# Patient Record
Sex: Male | Born: 1965 | Race: Black or African American | Hispanic: No | Marital: Single | Smoking: Current every day smoker
Health system: Southern US, Community
[De-identification: ages and names within clinical notes are randomized; demographics above are authoritative.]

## PROBLEM LIST (undated history)

## (undated) DIAGNOSIS — Z9119 Patient's noncompliance with other medical treatment and regimen: Secondary | ICD-10-CM

## (undated) DIAGNOSIS — I10 Essential (primary) hypertension: Secondary | ICD-10-CM

## (undated) DIAGNOSIS — Z91199 Patient's noncompliance with other medical treatment and regimen due to unspecified reason: Secondary | ICD-10-CM

## (undated) DIAGNOSIS — E119 Type 2 diabetes mellitus without complications: Secondary | ICD-10-CM

---

## 2002-04-23 ENCOUNTER — Emergency Department (HOSPITAL_COMMUNITY): Admission: EM | Admit: 2002-04-23 | Discharge: 2002-04-23 | Payer: Self-pay | Admitting: Emergency Medicine

## 2004-01-03 ENCOUNTER — Other Ambulatory Visit: Payer: Self-pay

## 2004-08-26 ENCOUNTER — Emergency Department: Payer: Self-pay | Admitting: Emergency Medicine

## 2004-09-06 ENCOUNTER — Emergency Department: Payer: Self-pay | Admitting: Emergency Medicine

## 2005-08-30 ENCOUNTER — Emergency Department: Payer: Self-pay | Admitting: Internal Medicine

## 2005-10-04 ENCOUNTER — Emergency Department: Payer: Self-pay | Admitting: Emergency Medicine

## 2005-12-27 ENCOUNTER — Emergency Department: Payer: Self-pay | Admitting: General Practice

## 2005-12-28 ENCOUNTER — Emergency Department: Payer: Self-pay | Admitting: Emergency Medicine

## 2007-10-09 ENCOUNTER — Emergency Department: Payer: Self-pay | Admitting: Emergency Medicine

## 2010-03-02 ENCOUNTER — Emergency Department: Payer: Self-pay | Admitting: Emergency Medicine

## 2010-03-27 ENCOUNTER — Observation Stay: Payer: Self-pay | Admitting: Specialist

## 2010-04-06 ENCOUNTER — Emergency Department: Payer: Self-pay | Admitting: Emergency Medicine

## 2010-04-07 ENCOUNTER — Inpatient Hospital Stay: Payer: Self-pay | Admitting: Internal Medicine

## 2010-04-29 ENCOUNTER — Emergency Department: Payer: Self-pay | Admitting: Emergency Medicine

## 2010-06-18 ENCOUNTER — Inpatient Hospital Stay: Payer: Self-pay | Admitting: Internal Medicine

## 2010-07-06 ENCOUNTER — Emergency Department: Payer: Self-pay | Admitting: Emergency Medicine

## 2010-11-06 ENCOUNTER — Emergency Department: Payer: Self-pay | Admitting: *Deleted

## 2010-11-09 ENCOUNTER — Emergency Department: Payer: Self-pay | Admitting: Emergency Medicine

## 2010-11-15 ENCOUNTER — Emergency Department: Payer: Self-pay | Admitting: Emergency Medicine

## 2010-11-23 ENCOUNTER — Emergency Department: Payer: Self-pay | Admitting: Emergency Medicine

## 2011-01-15 ENCOUNTER — Emergency Department: Payer: Self-pay | Admitting: Emergency Medicine

## 2012-08-01 ENCOUNTER — Emergency Department: Payer: Self-pay | Admitting: Emergency Medicine

## 2012-11-01 ENCOUNTER — Emergency Department: Payer: Self-pay | Admitting: Emergency Medicine

## 2012-11-03 ENCOUNTER — Emergency Department: Payer: Self-pay | Admitting: Emergency Medicine

## 2014-11-24 ENCOUNTER — Emergency Department
Admission: EM | Admit: 2014-11-24 | Discharge: 2014-11-24 | Disposition: A | Discharge status: Home / Self Care | Payer: Self-pay | Attending: Emergency Medicine | Admitting: Emergency Medicine

## 2014-11-24 ENCOUNTER — Emergency Department: Payer: Self-pay

## 2014-11-24 DIAGNOSIS — X58XXXA Exposure to other specified factors, initial encounter: Secondary | ICD-10-CM | POA: Insufficient documentation

## 2014-11-24 DIAGNOSIS — Z79899 Other long term (current) drug therapy: Secondary | ICD-10-CM | POA: Insufficient documentation

## 2014-11-24 DIAGNOSIS — I10 Essential (primary) hypertension: Secondary | ICD-10-CM | POA: Insufficient documentation

## 2014-11-24 DIAGNOSIS — Y9389 Activity, other specified: Secondary | ICD-10-CM | POA: Insufficient documentation

## 2014-11-24 DIAGNOSIS — Z720 Tobacco use: Secondary | ICD-10-CM | POA: Insufficient documentation

## 2014-11-24 DIAGNOSIS — S46311A Strain of muscle, fascia and tendon of triceps, right arm, initial encounter: Secondary | ICD-10-CM | POA: Insufficient documentation

## 2014-11-24 DIAGNOSIS — Y9289 Other specified places as the place of occurrence of the external cause: Secondary | ICD-10-CM | POA: Insufficient documentation

## 2014-11-24 DIAGNOSIS — Y998 Other external cause status: Secondary | ICD-10-CM | POA: Insufficient documentation

## 2014-11-24 DIAGNOSIS — Z794 Long term (current) use of insulin: Secondary | ICD-10-CM | POA: Insufficient documentation

## 2014-11-24 DIAGNOSIS — E119 Type 2 diabetes mellitus without complications: Secondary | ICD-10-CM | POA: Insufficient documentation

## 2014-11-24 HISTORY — DX: Essential (primary) hypertension: I10

## 2014-11-24 HISTORY — DX: Type 2 diabetes mellitus without complications: E11.9

## 2014-11-24 MED ORDER — FENTANYL CITRATE (PF) 100 MCG/2ML IJ SOLN
50.0000 ug | Freq: Once | INTRAMUSCULAR | Status: AC
  Administered 2014-11-24: 50 ug via INTRAVENOUS

## 2014-11-24 MED ORDER — FENTANYL CITRATE (PF) 100 MCG/2ML IJ SOLN
INTRAMUSCULAR | Status: AC
  Administered 2014-11-24: 50 ug via INTRAVENOUS
  Filled 2014-11-24: qty 2

## 2014-11-24 MED ORDER — DOCUSATE SODIUM 100 MG PO CAPS: ORAL_CAPSULE | ORAL | Status: DC

## 2014-11-24 MED ORDER — HYDROMORPHONE HCL 1 MG/ML IJ SOLN
1.0000 mg | Freq: Once | INTRAMUSCULAR | Status: AC
Start: 2014-11-24 — End: 2014-11-24
  Administered 2014-11-24: 1 mg via INTRAVENOUS

## 2014-11-24 MED ORDER — OXYCODONE-ACETAMINOPHEN 5-325 MG PO TABS: 1.0000 | ORAL_TABLET | ORAL | Status: DC | PRN

## 2014-11-24 MED ORDER — HYDROMORPHONE HCL 1 MG/ML IJ SOLN
INTRAMUSCULAR | Status: AC
  Administered 2014-11-24: 1 mg via INTRAVENOUS
  Filled 2014-11-24: qty 1

## 2014-11-24 MED ORDER — OXYCODONE-ACETAMINOPHEN 5-325 MG PO TABS
2.0000 | ORAL_TABLET | ORAL | Status: AC
  Administered 2014-11-24: 2 via ORAL

## 2014-11-24 MED ORDER — OXYCODONE-ACETAMINOPHEN 5-325 MG PO TABS
ORAL_TABLET | ORAL | Status: AC
  Administered 2014-11-24: 2 via ORAL
  Filled 2014-11-24: qty 2

## 2014-11-24 MED ORDER — HYDROMORPHONE HCL 1 MG/ML IJ SOLN
INTRAMUSCULAR | Status: AC
  Filled 2014-11-24: qty 1

## 2014-11-24 MED ORDER — HYDROMORPHONE HCL 1 MG/ML IJ SOLN
1.0000 mg | Freq: Once | INTRAMUSCULAR | Status: AC
  Administered 2014-11-24: 1 mg via INTRAVENOUS

## 2014-11-24 NOTE — ED Notes (Signed)
Pt was bench pressing 335lbs at gym and sudden onset of pain to left elbow.  Pt reports hearing popping sound.

## 2014-11-24 NOTE — ED Notes (Signed)
MD at bedside for patient update

## 2014-11-24 NOTE — Discharge Instructions (Signed)
As we discussed, it is very important that she would not use your arm to carry any weight until the orthopedic surgeon says it is okay to do so.  Please keep your splint in place and use her sling as needed for support.  Keep the splint clean and dry.  Call Dr. Martha ClanKrasinski tomorrow for an appointment this week if possible to follow-up.  If you develop new or worsening symptoms that concern you, please return to the emergency department.  Take Percocet as prescribed. Do not drink alcohol, drive or participate in any other potentially dangerous activities while taking this medication as it may make you sleepy. Do not take this medication with any other sedating medications, either prescription or over-the-counter. If you were prescribed Percocet or Vicodin, do not take these with acetaminophen (Tylenol) as it is already contained within these medications.   This medication is an opiate (or narcotic) pain medication and can be habit forming.  Use it as little as possible to achieve adequate pain control.  Do not use or use it with extreme caution if you have a history of opiate abuse or dependence.  If you are on a pain contract with your primary care doctor or a pain specialist, be sure to let them know you were prescribed this medication today from the [facility].  This medication is intended for your use only - do not give any to anyone else and keep it in a secure place where nobody else, especially children, have access to it.  It will also cause or worsen constipation, so you may want to consider taking an over-the-counter stool softener while you are taking this medication.   Tendon Injury Tendons are strong, cordlike structures that connect muscle to bone. Tendons are made up of woven fibers, like a rope. A tendon injury is a tear (rupture) of the tendon. The rupture may be partial (only a few of the fibers in your tendon rupture) or complete (your entire tendon  ruptures). CAUSES  Tendon injuries can be caused by high-stress activities, such as sports. They also can be caused by a repetitive injury or by a single injury from an excessive, rapid force. SYMPTOMS  Symptoms of tendon injury include pain when you move the joint close to the tendon. Other symptoms are swelling, redness, and warmth. DIAGNOSIS  Tendon injuries often can be diagnosed by physical exam. However, sometimes an X-ray exam or advanced imaging, such as magnetic resonance imaging (MRI), is necessary to determine the extent of the injury. TREATMENT  Partial tendon ruptures often can be treated with immobilization. A splint, bandage, or removable brace usually is used to immobilize the injured tendon. Most injured tendons need to be immobilized for 1-2 months before they are completely healed. Complete tendon ruptures may require surgical reattachment. Document Released: 08/03/2004 Document Revised: 06/15/2011 Document Reviewed: 09/17/2011 [facility] Patient Information 2015 [employer], [place]. This information is not intended to replace advice given to you by your health care provider. Make sure you discuss any questions you have with your health care provider.

## 2014-11-24 NOTE — ED Notes (Signed)

## 2014-11-24 NOTE — ED Provider Notes (Signed)
[facility] Provider Note  ____________________________________________  Time seen: Approximately 7:22 PM  I have reviewed the triage vital signs and the nursing notes.   HISTORY  Chief Complaint Elbow Pain    HPI Gary Henderson is a 49 y.o. male with a history of diabetes and hypertension who was bench pressing 335 pounds when he felt a snap in his right elbow.  He reports that it was deformed with a "huge bulge coming out the back".He squeezed it impressed on it and the pain is slightly improved now but he required Dilaudid for pain control after his arrival in the ED.  He has no numbness or tingling down the arm, just pain in the elbow and the distal upper arm.  He sustained no other injuries.  The pain is severe.   Past Medical History  Diagnosis Date  . Diabetes mellitus without complication   . Hypertension     There are no active problems to display for this patient.   History reviewed. No pertinent past surgical history.  Current Outpatient Rx  Name  Route  Sig  Dispense  Refill  . insulin glargine (LANTUS) 100 UNIT/ML injection   Subcutaneous   Inject 60 Units into the skin daily.         . metFORMIN (GLUCOPHAGE) 500 MG tablet   Oral   Take 500 mg by mouth 2 (two) times daily with a meal.           Allergies Review of patient's allergies indicates no known allergies.  History reviewed. No pertinent family history.  Social History History  Substance Use Topics  . Smoking status: Current Every Day Smoker -- 1.00 packs/day for 20 years    Types: Cigarettes  . Smokeless tobacco: Never Used  . Alcohol Use: No    Review of Systems Constitutional: No fever/chills Eyes: No visual changes. ENT: No sore throat. Cardiovascular: Denies chest pain. Respiratory: Denies shortness of breath. Gastrointestinal: No abdominal pain.  No nausea, no vomiting.  No diarrhea.  No constipation. Genitourinary: Negative for  dysuria. Musculoskeletal: Pain with movement of the right elbow  Skin: Negative for rash. Neurological: Negative for headaches, focal weakness or numbness.  10-point ROS otherwise negative.  ____________________________________________   PHYSICAL EXAM:  VITAL SIGNS: ED Triage Vitals  Enc Vitals Group     BP --      Pulse Rate 11/24/14 1901 93     Resp --      Temp 11/24/14 1901 98.2 F (36.8 C)     Temp Source 11/24/14 1901 Oral     SpO2 11/24/14 1901 100 %     Weight 11/24/14 1901 230 lb (104.327 kg)     Height 11/24/14 1901 6' (1.829 m)     Head Cir --      Peak Flow --      Pain Score 11/24/14 1902 10     Pain Loc --      Pain Edu? --      Excl. in GC? --     Constitutional: Alert and oriented. Well appearing and in mild to moderate distress from his elbow pain. Eyes: Conjunctivae are normal. PERRL. EOMI. Head: Atraumatic. Nose: No congestion/rhinnorhea. Cardiovascular: Normal rate, regular rhythm. Grossly normal heart sounds.  Good peripheral circulation. Respiratory: Normal respiratory effort.  No retractions. Lungs CTAB. Musculoskeletal: Right arm tenderness with any motion of his right elbow, particularly flexion.  Tenderness to palpation just proximal to the elbow in the region of  the triceps.  No gross deformity, no joint effusion, no skin defects.  Neurovascularly intact distal to the wound with normal cap refill, good arterial pulses, and normal sensation. Neurologic:  Normal speech and language. No gross focal neurologic deficits are appreciated. Speech is normal. No gait instability. Skin:  Skin is warm, dry and intact. No rash noted. Psychiatric: Mood and affect are normal. Speech and behavior are normal.  ____________________________________________   LABS (all labs ordered are listed, but only abnormal results are displayed)  Not indicated ____________________________________________  EKG  Not  indicated ____________________________________________  RADIOLOGY  Dg Elbow Complete Right  11/24/2014   CLINICAL DATA:  Pain after weight lifting injury today.  EXAM: RIGHT ELBOW - COMPLETE 3+ VIEW  COMPARISON:  None.  FINDINGS: There is no fracture or dislocation or joint effusion. There is calcification in the distal triceps tendon with soft tissue swelling. I suspect the patient has an acute rupture of the triceps tendon. 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  IMPRESSION: No acute osseous abnormality. Probable acute rupture of the distal triceps tendon with visible degenerative changes at the insertion and within the tendon.   Electronically Signed   By: Francene BoyersJames  Maxwell M.D.   On: 11/24/2014 19:17    ____________________________________________   PROCEDURES  Procedure(s) performed: splinting, see procedure note(s).  SPLINT APPLICATION Date/Time: 9:19 PM Authorized by: Loleta RoseFORBACH, Jocob Dambach Consent: Verbal consent obtained. Risks and benefits: risks, benefits and alternatives were discussed Consent given by: patient Splint applied by: ED technician Location details: right Splint type: long arm splint at 90 degrees right elbow flexion Supplies used: ortho-glass Post-procedure: The splinted body part was neurovascularly unchanged following the procedure. Patient tolerance: Patient tolerated the procedure well with no immediate complications.   Critical Care performed: No  ____________________________________________   INITIAL IMPRESSION / ASSESSMENT AND PLAN / ED COURSE  Pertinent labs & imaging results that were available during my care of the patient were reviewed by me and considered in my medical decision making (see chart for details).  It is possible that the patient dislocated his elbow and reduced it himself, but it is definitely not dislocated at this time.  I discussed the  case with Dr. Martha ClanKrasinski by phone and he concurred with my plan of immobilizing in a long-arm splint and following up in the clinic.  I gave the patient my usual and customary return precautions and prescribed him Percocet for pain control as well as a stool softener.  I stressed to him the importance of outpatient follow-up as he will likely need an outpatient MRI and surgery to correct his defect. ____________________________________________   FINAL CLINICAL IMPRESSION(S) / ED DIAGNOSES  Final diagnoses:  Triceps tendon rupture, right, initial encounter      Loleta Roseory Stillman Buenger, MD 11/24/14 2120

## 2017-06-07 ENCOUNTER — Encounter (HOSPITAL_COMMUNITY): Payer: Self-pay | Admitting: Emergency Medicine

## 2017-06-07 ENCOUNTER — Emergency Department (HOSPITAL_COMMUNITY)
Admission: EM | Admit: 2017-06-07 | Discharge: 2017-06-07 | Disposition: A | Discharge status: Home / Self Care | Payer: Self-pay | Attending: Emergency Medicine | Admitting: Emergency Medicine

## 2017-06-07 DIAGNOSIS — E1165 Type 2 diabetes mellitus with hyperglycemia: Secondary | ICD-10-CM | POA: Insufficient documentation

## 2017-06-07 DIAGNOSIS — F17210 Nicotine dependence, cigarettes, uncomplicated: Secondary | ICD-10-CM | POA: Insufficient documentation

## 2017-06-07 DIAGNOSIS — L02214 Cutaneous abscess of groin: Secondary | ICD-10-CM | POA: Insufficient documentation

## 2017-06-07 DIAGNOSIS — R739 Hyperglycemia, unspecified: Secondary | ICD-10-CM

## 2017-06-07 DIAGNOSIS — Z7984 Long term (current) use of oral hypoglycemic drugs: Secondary | ICD-10-CM | POA: Insufficient documentation

## 2017-06-07 DIAGNOSIS — I10 Essential (primary) hypertension: Secondary | ICD-10-CM | POA: Insufficient documentation

## 2017-06-07 LAB — CBG MONITORING, ED: GLUCOSE-CAPILLARY: 338 mg/dL — AB (ref 65–99)

## 2017-06-07 MED ORDER — SULFAMETHOXAZOLE-TRIMETHOPRIM 800-160 MG PO TABS: 1.0000 | ORAL_TABLET | Freq: Two times a day (BID) | ORAL | 0 refills | Status: AC

## 2017-06-07 MED ORDER — LIDOCAINE-EPINEPHRINE (PF) 2 %-1:200000 IJ SOLN
20.0000 mL | Freq: Once | INTRAMUSCULAR | Status: AC
  Administered 2017-06-07: 20 mL
  Filled 2017-06-07: qty 20

## 2017-06-07 NOTE — Discharge Instructions (Signed)
Hold the antibiotic and only take it if your swelling or pain become worse or if you develop fever, chills, bodyaches like you are getting sick. Contact a health care provider if: You have more redness, swelling, or pain around your abscess. You have more fluid or blood coming from your abscess. Your abscess feels warm to the touch. You have more pus or a bad smell coming from your abscess. You have a fever. You have muscle aches. You have chills or a general ill feeling. Get help right away if: You have severe pain. You see red streaks on your skin spreading away from the abscess.

## 2017-06-07 NOTE — ED Triage Notes (Signed)
Patient c/o draining abscess to right thigh x2 days. Denies fevers. Ambulatory.

## 2017-06-07 NOTE — ED Provider Notes (Signed)
[facility]-EMERGENCY DEPT Provider Note   CSN: [id number] Arrival date & time: 06/07/17  1056     History   Chief Complaint Chief Complaint  Patient presents with  . Abscess    HPI Gary Henderson is a 51 y.o. male thigh abscess the past medical history of hypertension and diabetes who presents the emergency department with chief complaint of right.  He noticed development of the abscess about 1 week ago.  He states that it enlarged and then seem to go away.  Over the past 3 days it has become more painful and swollen and has begun to drain some.  He denies fevers or chills.  He has a prior history of MRSA infection.  He denies any pain in the groin, pain out of proportion.  His blood sugar is noted to be elevated today which he blames on the recent Thanksgiving Feist.  HPI  Past Medical History:  Diagnosis Date  . Diabetes mellitus without complication (HCC)   . Hypertension     There are no active problems to display for this patient.   History reviewed. No pertinent surgical history.     Home Medications    Prior to Admission medications   Medication Sig Start Date End Date Taking? Authorizing Provider  docusate sodium (COLACE) 100 MG capsule Take 1 tablet once or twice daily as needed for constipation while taking narcotic pain medicine 11/24/14   Loleta RoseForbach, Cory, MD  insulin glargine (LANTUS) 100 UNIT/ML injection Inject 60 Units into the skin daily.    [provider]  metFORMIN (GLUCOPHAGE) 500 MG tablet Take 500 mg by mouth 2 (two) times daily with a meal.    [provider]  oxyCODONE-acetaminophen (ROXICET) 5-325 MG per tablet Take 1-2 tablets by mouth every 4 (four) hours as needed for severe pain. 11/24/14   Loleta RoseForbach, Cory, MD    Family History No family history on file.  Social History Social History   Tobacco Use  . Smoking status: Current Every Day Smoker    Packs/day: 1.00    Years: 20.00    Pack years: 20.00    Types: Cigarettes  . Smokeless tobacco: Never Used  Substance Use Topics  . Alcohol use: No  . Drug use: No     Allergies   Patient has no known allergies.   Review of Systems Review of Systems  Ten systems reviewed and are negative for acute change, except as noted in the HPI.   Physical Exam Updated Vital Signs BP (!) 148/96 (BP Location: Right Arm)   Pulse 99   Temp 98.2 F (36.8 C) (Oral)   Resp 18   SpO2 100%   Physical Exam  Constitutional: He is oriented to person, place, and time. He appears well-developed and well-nourished. No distress.  HENT:  Head: Normocephalic and atraumatic.  Eyes: Conjunctivae and EOM are normal. Pupils are equal, round, and reactive to light. No scleral icterus.  Neck: Normal range of motion. Neck supple.  Cardiovascular: Normal rate, regular rhythm and normal heart sounds.  Pulmonary/Chest: Effort normal and breath sounds normal. No respiratory distress.  Abdominal: Soft. There is no tenderness.  Musculoskeletal: He exhibits no edema.  Neurological: He is alert and oriented to person, place, and time.  Skin: Skin is warm and dry. He is not diaphoretic.  6 cm tense abscess on the right medial thigh.  There is some mild erythema and induration surrounding.  Inferior edge appears to be draining purulent and bloody fluid  however there appears to be multiple loculated abscesses by palpation as I am unable to express further drainage with pressure on the abscess.  Psychiatric: His behavior is normal.  Nursing note and vitals reviewed.    ED Treatments / Results  Labs (all labs ordered are listed, but only abnormal results are displayed) Labs Reviewed  CBG MONITORING, ED - Abnormal; Notable for the following components:      Result Value   Glucose-Capillary 338 (*)    All other components within normal limits    EKG  EKG Interpretation None       Radiology No results found.  Procedures Procedures (including critical care  time)  INCISION AND DRAINAGE Performed by: Arthor CaptainHarris, Jahari Billy Consent: Verbal consent obtained. Risks and benefits: risks, benefits and alternatives were discussed Type: abscess  Body area: Right thigh  Anesthesia: local infiltration  Incision was made with a scalpel.  Local anesthetic: lidocaine 2% w epinephrine  Anesthetic total: 8 ml  Complexity: complex Blunt dissection to break up loculations  Drainage: purulent  Drainage amount: moderate    Patient tolerance: Patient tolerated the procedure well with no immediate complications.   Medications Ordered in ED Medications  lidocaine-EPINEPHrine (XYLOCAINE W/EPI) 2 %-1:200000 (PF) injection 20 mL (not administered)     Initial Impression / Assessment and Plan / ED Course  I have reviewed the triage vital signs and the nursing notes.  Pertinent labs & imaging results that were available during my care of the patient were reviewed by me and considered in my medical decision making (see chart for details).     Patient with skin abscess amenable to incision and drainage.  Abscess was not large enough to warrant packing or drain placemet,  wound recheck in 2 days. Encouraged home warm soaks and flushing.  Will d/c to home.    Final Clinical Impressions(s) / ED Diagnoses   Final diagnoses:  None    ED Discharge Orders    None       Arthor CaptainHarris, Joeanne Robicheaux, PA-C 06/07/17 1329    Alvira MondaySchlossman, Erin, MD 06/09/17 [phone]

## 2017-09-03 ENCOUNTER — Emergency Department (HOSPITAL_COMMUNITY)
Admission: EM | Admit: 2017-09-03 | Discharge: 2017-09-03 | Disposition: A | Discharge status: Home / Self Care | Payer: Self-pay | Attending: Emergency Medicine | Admitting: Emergency Medicine

## 2017-09-03 ENCOUNTER — Encounter (HOSPITAL_COMMUNITY): Payer: Self-pay | Admitting: Emergency Medicine

## 2017-09-03 DIAGNOSIS — Z794 Long term (current) use of insulin: Secondary | ICD-10-CM | POA: Insufficient documentation

## 2017-09-03 DIAGNOSIS — L0291 Cutaneous abscess, unspecified: Secondary | ICD-10-CM

## 2017-09-03 DIAGNOSIS — K0889 Other specified disorders of teeth and supporting structures: Secondary | ICD-10-CM | POA: Insufficient documentation

## 2017-09-03 DIAGNOSIS — K047 Periapical abscess without sinus: Secondary | ICD-10-CM | POA: Insufficient documentation

## 2017-09-03 DIAGNOSIS — I10 Essential (primary) hypertension: Secondary | ICD-10-CM | POA: Insufficient documentation

## 2017-09-03 DIAGNOSIS — K1379 Other lesions of oral mucosa: Secondary | ICD-10-CM | POA: Insufficient documentation

## 2017-09-03 DIAGNOSIS — F17210 Nicotine dependence, cigarettes, uncomplicated: Secondary | ICD-10-CM | POA: Insufficient documentation

## 2017-09-03 DIAGNOSIS — E119 Type 2 diabetes mellitus without complications: Secondary | ICD-10-CM | POA: Insufficient documentation

## 2017-09-03 DIAGNOSIS — K1370 Unspecified lesions of oral mucosa: Secondary | ICD-10-CM

## 2017-09-03 LAB — CBG MONITORING, ED: GLUCOSE-CAPILLARY: 206 mg/dL — AB (ref 65–99)

## 2017-09-03 MED ORDER — CLINDAMYCIN HCL 300 MG PO CAPS: 300.0000 mg | ORAL_CAPSULE | Freq: Four times a day (QID) | ORAL | 0 refills | Status: DC

## 2017-09-03 MED ORDER — IBUPROFEN 600 MG PO TABS: 600.0000 mg | ORAL_TABLET | Freq: Four times a day (QID) | ORAL | 0 refills | Status: DC | PRN

## 2017-09-03 MED ORDER — BENZOCAINE 10 % MT GEL
Freq: Once | OROMUCOSAL | Status: AC
  Administered 2017-09-03: 09:00:00 via OROMUCOSAL
  Filled 2017-09-03: qty 9.4

## 2017-09-03 MED ORDER — ACETAMINOPHEN 500 MG PO TABS
1000.0000 mg | ORAL_TABLET | Freq: Once | ORAL | Status: AC
  Administered 2017-09-03: 1000 mg via ORAL
  Filled 2017-09-03: qty 2

## 2017-09-03 MED ORDER — BENZOCAINE 10 % MT GEL: 1.0000 "application " | OROMUCOSAL | 0 refills | Status: DC | PRN

## 2017-09-03 MED ORDER — IBUPROFEN 200 MG PO TABS
600.0000 mg | ORAL_TABLET | Freq: Once | ORAL | Status: AC
  Administered 2017-09-03: 600 mg via ORAL
  Filled 2017-09-03: qty 3

## 2017-09-03 NOTE — ED Triage Notes (Signed)
Patient c/o sore in upper roof of mouth x 4 days. Reports also c/o pain left dental pain.

## 2017-09-03 NOTE — Discharge Instructions (Signed)
Please take entire course of antibiotics for the next 7 days.  You may use ibuprofen and Tylenol for pain as well as topical Orajel ointment.  If dental pain persist he will need to see a dentist, there was no obvious evidence of abscess on exam today.  The skin abscess on your thigh appears to have completely drained and is improving, antibiotics will help make sure this continues to heal.  You need to follow-up with your primary care doctor for continued management of your diabetes as this puts you at risk for recurrent skin infections.  If you develop worsening swelling pain or redness around the area of continued drainage, re-formation of abscess, fevers or chills please return to the ED for reevaluation.

## 2017-09-03 NOTE — ED Provider Notes (Signed)
[facility]-EMERGENCY DEPT Provider Note   CSN: [id number] Arrival date & time: 09/03/17  4098     History   Chief Complaint Chief Complaint  Patient presents with  . sore in mouth    HPI  Gary Henderson is a 52 y.o. Male with a history of diabetes and hypertension who presents to the ED for evaluation of sore on the upper the mouth for the past 4 days, patient also complaining of left dental pain.  Patient denies any injury to the roof of the mouth and [place] while eating hot foods, but reports persistent pain over the ridges of the hard palate on the left side of the mouth.  Denies any noted abscess or drainage from the area.  No difficulty swallowing, breathing, eating or drinking.  Patient has seen a dentist in the past but does not have when he regularly follows up with.  No facial swelling or pain in the neck.  Patient has tried Tylenol with mild improvement in pain has not tried any medications, no other aggravating or alleviating factors.  Patient also reports he has been seen previously for abscess to the medial right thigh, reports this seemed to recur a few days ago and yesterday started draining a little bit of purulent fluid, feeling somewhat better today, swelling and redness has improved and patient reports no further drainage but would like to have it evaluated.  Patient does have a history of diabetes and has had abscesses in the past reports his sugar typically runs between 180 and 200, he has not followed up with this with his PCP in the past 3 months.  Patient denies any fevers or chills, no nausea or vomiting.  Denies any presence of abscesses elsewhere      Past Medical History:  Diagnosis Date  . Diabetes mellitus without complication (HCC)   . Hypertension     There are no active problems to display for this patient.   History reviewed. No pertinent surgical history.     Home Medications    Prior to Admission medications     Medication Sig Start Date End Date Taking? Authorizing Provider  benzocaine (ORAJEL) 10 % mucosal gel Use as directed 1 application in the mouth or throat as needed for mouth pain. 09/03/17   Dartha Lodge, PA-C  clindamycin (CLEOCIN) 300 MG capsule Take 1 capsule (300 mg total) by mouth 4 (four) times daily. X 7 days 09/03/17   Dartha Lodge, PA-C  docusate sodium (COLACE) 100 MG capsule Take 1 tablet once or twice daily as needed for constipation while taking narcotic pain medicine 11/24/14   Loleta Rose, MD  ibuprofen (ADVIL,MOTRIN) 600 MG tablet Take 1 tablet (600 mg total) by mouth every 6 (six) hours as needed. 09/03/17   Dartha Lodge, PA-C  insulin glargine (LANTUS) 100 UNIT/ML injection Inject 60 Units into the skin daily.    [provider]  metFORMIN (GLUCOPHAGE) 500 MG tablet Take 500 mg by mouth 2 (two) times daily with a meal.    [provider]  oxyCODONE-acetaminophen (ROXICET) 5-325 MG per tablet Take 1-2 tablets by mouth every 4 (four) hours as needed for severe pain. 11/24/14   Loleta Rose, MD    Family History No family history on file.  Social History Social History   Tobacco Use  . Smoking status: Current Every Day Smoker    Packs/day: 1.00    Years: 20.00    Pack years: 20.00  Types: Cigarettes  . Smokeless tobacco: Never Used  Substance Use Topics  . Alcohol use: No  . Drug use: No     Allergies   Patient has no known allergies.   Review of Systems Review of Systems  Constitutional: Negative for chills and fever.  HENT: Positive for dental problem. Negative for drooling and trouble swallowing.   Gastrointestinal: Negative for nausea and vomiting.  Skin: Positive for wound. Negative for color change and rash.     Physical Exam Updated Vital Signs BP (!) 148/88   Pulse 88   Temp 98.2 F (36.8 C) (Oral)   Resp 16   SpO2 99%   Physical Exam  Constitutional: He appears well-developed and well-nourished. No distress.   HENT:  Head: Normocephalic and atraumatic.  Mouth/Throat: Uvula is midline. Abnormal dentition. No dental abscesses.    Posterior oropharynx clear, anterior and posterior arches visible, no sublingual swelling or tenderness, no obvious dental abscess present  Eyes: Right eye exhibits no discharge. Left eye exhibits no discharge.  Pulmonary/Chest: Effort normal. No respiratory distress.  Neurological: He is alert. Coordination normal.  Skin: Skin is warm and dry. Capillary refill takes less than 2 seconds. He is not diaphoretic.  2 cm open lesion right medial thigh, no surrounding erythema or warmth, no induration or fluctuance, no drainage noted on palpation, this appears to be healing  Psychiatric: He has a normal mood and affect. His behavior is normal.  Nursing note and vitals reviewed.    ED Treatments / Results  Labs (all labs ordered are listed, but only abnormal results are displayed) Labs Reviewed  CBG MONITORING, ED - Abnormal; Notable for the following components:      Result Value   Glucose-Capillary 206 (*)    All other components within normal limits    EKG  EKG Interpretation None       Radiology No results found.  Procedures Procedures (including critical care time)  Medications Ordered in ED Medications  ibuprofen (ADVIL,MOTRIN) tablet 600 mg (600 mg Oral Given 09/03/17 0814)  acetaminophen (TYLENOL) tablet 1,000 mg (1,000 mg Oral Given 09/03/17 0815)  benzocaine (ORAJEL) 10 % mucosal gel ( Mouth/Throat Given 09/03/17 0850)     Initial Impression / Assessment and Plan / ED Course  I have reviewed the triage vital signs and the nursing notes.  Pertinent labs & imaging results that were available during my care of the patient were reviewed by me and considered in my medical decision making (see chart for details).  Patient presents for evaluation of painful spot on the roof of his mouth and some dental pain.  Vision also reports history of medial thigh  abscess, with some drainage from the area.  Patient is well-appearing, mildly hypertensive but is not taken his blood pressure medications today, all other vitals normal.  Patient with tender irritated ridge on the roof of the mouth, no fluctuance, no obvious abscess, I suspect this is irritated from food patient was eating, the patient is very concerned for dental abscess will treat with antibiotics, pain improved here in the ED after Tylenol, ibuprofen and topical benzocaine, will prescribe topical benzocaine for the patient as well he is encouraged to follow-up with his dentist, return precautions regarding this provided as well as dental resources.  Patient also reports medial thigh abscess with some drainage, exam patient has small 1-2 cm area that appears open, no longer draining fluid, no surrounding induration erythema or warmth and no fluctuance I feel this is likely resolving  on its own, will place patient on clindamycin which will cover for dental infection as well as skin infection and have him follow-up with primary care.  Strict return precautions regarding this provided.  Patient will need to follow-up with his PCP for continued management of his diabetes as his sugar is 206 today, discussed increased risk for recurrent skin infections with poorly controlled diabetes.  This time patient is stable for discharge and in no acute distress reports vast improvement in pain.  Expresses understanding and is in agreement with plan.  Final Clinical Impressions(s) / ED Diagnoses   Final diagnoses:  Mouth lesion  Pain, dental  Abscess    ED Discharge Orders        Ordered    clindamycin (CLEOCIN) 300 MG capsule  4 times daily     09/03/17 0941    benzocaine (ORAJEL) 10 % mucosal gel  As needed     09/03/17 0941    ibuprofen (ADVIL,MOTRIN) 600 MG tablet  Every 6 hours PRN     09/03/17 0941       Dartha Lodge, PA-C 09/03/17 Foye Clock, MD 09/11/17 [phone]

## 2017-09-03 NOTE — ED Notes (Signed)
CGB 206

## 2017-10-21 ENCOUNTER — Encounter (HOSPITAL_COMMUNITY): Payer: Self-pay | Admitting: Emergency Medicine

## 2017-10-21 ENCOUNTER — Emergency Department (HOSPITAL_COMMUNITY)
Admission: EM | Admit: 2017-10-21 | Discharge: 2017-10-21 | Disposition: A | Discharge status: Home / Self Care | Payer: Self-pay | Attending: Physician Assistant | Admitting: Physician Assistant

## 2017-10-21 DIAGNOSIS — E119 Type 2 diabetes mellitus without complications: Secondary | ICD-10-CM | POA: Insufficient documentation

## 2017-10-21 DIAGNOSIS — K0889 Other specified disorders of teeth and supporting structures: Secondary | ICD-10-CM | POA: Insufficient documentation

## 2017-10-21 DIAGNOSIS — I10 Essential (primary) hypertension: Secondary | ICD-10-CM | POA: Insufficient documentation

## 2017-10-21 DIAGNOSIS — Z794 Long term (current) use of insulin: Secondary | ICD-10-CM | POA: Insufficient documentation

## 2017-10-21 DIAGNOSIS — F17210 Nicotine dependence, cigarettes, uncomplicated: Secondary | ICD-10-CM | POA: Insufficient documentation

## 2017-10-21 MED ORDER — CLINDAMYCIN HCL 150 MG PO CAPS: 450.0000 mg | ORAL_CAPSULE | Freq: Three times a day (TID) | ORAL | 0 refills | Status: AC

## 2017-10-21 NOTE — ED Provider Notes (Signed)
[facility]-EMERGENCY DEPT Provider Note   CSN: [id number] Arrival date & time: 10/21/17  0856     History   Chief Complaint Chief Complaint  Patient presents with  . mouth mass    HPI Gary Henderson is a 52 y.o. male with a past medical history of diabetes, hypertension who presents to ED for evaluation of 56-month history of dental pain and possible abscess to the roof of his mouth.  Patient states that he was seen and evaluated 2 months ago when symptoms began and reports improvement in the abscess when antibiotics were given.  However, he states that the abscess has returned and is causing him discomfort throughout the entire left side of his mouth.  He denies any fever, drainage.  He has not seen a dentist in several years although he is able to follow up with one.  Denies any fevers, trauma to the area, drainage or bleeding from site, shortness of breath, sore throat, drooling or trismus.  HPI  Past Medical History:  Diagnosis Date  . Diabetes mellitus without complication (HCC)   . Hypertension     There are no active problems to display for this patient.   History reviewed. No pertinent surgical history.      Home Medications    Prior to Admission medications   Medication Sig Start Date End Date Taking? Authorizing Provider  ibuprofen (ADVIL,MOTRIN) 600 MG tablet Take 1 tablet (600 mg total) by mouth every 6 (six) hours as needed. 09/03/17  Yes Dartha Lodge, PA-C  insulin glargine (LANTUS) 100 UNIT/ML injection Inject 60 Units into the skin daily.   Yes [provider]  losartan (COZAAR) 50 MG tablet Take 50 mg by mouth daily.   Yes [provider]  metFORMIN (GLUCOPHAGE) 500 MG tablet Take 500 mg by mouth 2 (two) times daily with a meal.   Yes [provider]  benzocaine (ORAJEL) 10 % mucosal gel Use as directed 1 application in the mouth or throat as needed for mouth pain. 09/03/17   Dartha Lodge, PA-C  clindamycin  (CLEOCIN) 150 MG capsule Take 3 capsules (450 mg total) by mouth 3 (three) times daily for 7 days. 10/21/17 10/28/17  Dietrich Pates, PA-C  docusate sodium (COLACE) 100 MG capsule Take 1 tablet once or twice daily as needed for constipation while taking narcotic pain medicine 11/24/14   Loleta Rose, MD  oxyCODONE-acetaminophen (ROXICET) 5-325 MG per tablet Take 1-2 tablets by mouth every 4 (four) hours as needed for severe pain. 11/24/14   Loleta Rose, MD    Family History No family history on file.  Social History Social History   Tobacco Use  . Smoking status: Current Every Day Smoker    Packs/day: 1.00    Years: 20.00    Pack years: 20.00    Types: Cigarettes  . Smokeless tobacco: Never Used  Substance Use Topics  . Alcohol use: No  . Drug use: No     Allergies   Gabapentin and Lisinopril   Review of Systems Review of Systems  Constitutional: Negative for chills and fever.  HENT: Positive for dental problem. Negative for congestion, drooling, facial swelling, postnasal drip, sore throat, trouble swallowing and voice change.   Respiratory: Negative for shortness of breath.   Cardiovascular: Negative for chest pain.  Gastrointestinal: Negative for nausea and vomiting.     Physical Exam Updated Vital Signs BP (!) 146/100 (BP Location: Left Arm)   Pulse 94   Temp 98.4  F (36.9 C) (Oral)   Resp 18   Ht 6' (1.829 m)   SpO2 100%   BMI 31.19 kg/m   Physical Exam  Constitutional: He appears well-developed and well-nourished. No distress.  Nontoxic appearing and in no acute distress.  HENT:  Head: Normocephalic and atraumatic.  Mouth/Throat: Uvula is midline. He does not have dentures. Oral lesions present. Abnormal dentition. Dental caries present. No dental abscesses or lacerations. No tonsillar exudate.    Erythema and tenderness of an apparent 1 cm area of induration on the roof of the mouth.  No fluctuance noted.  No active drainage at this time. Patient does not  appear to be in acute distress. No trismus or drooling present. No pooling of secretions. Patient is tolerating secretions and is not in respiratory distress. No neck pain or tenderness to palpation of the neck. Full active and passive range of motion of the neck. No evidence of RPA or PTA.  Eyes: Conjunctivae and EOM are normal. No scleral icterus.  Neck: Normal range of motion.  Pulmonary/Chest: Effort normal. No respiratory distress.  Neurological: He is alert.  Skin: No rash noted. He is not diaphoretic.  Psychiatric: He has a normal mood and affect.  Nursing note and vitals reviewed.    ED Treatments / Results  Labs (all labs ordered are listed, but only abnormal results are displayed) Labs Reviewed - No data to display  EKG None  Radiology No results found.  Procedures Procedures (including critical care time)  Medications Ordered in ED Medications - No data to display   Initial Impression / Assessment and Plan / ED Course  I have reviewed the triage vital signs and the nursing notes.  Pertinent labs & imaging results that were available during my care of the patient were reviewed by me and considered in my medical decision making (see chart for details).     Patient presents to ED for evaluation of returning of dental abscess.  He states that he was seen here 2 months ago and symptoms resolved with antibiotics at that time.  In the time between now and then he has not followed up with a dentist.  He now reports return of the abscess with pain in the left central incisor and throughout the entire left side of his mouth.  Denies any trouble breathing or trouble swallowing, drooling, trismus, drainage from site.  There is an indurated area on the roof of the mouth with no fluctuance or drainage noted.  This could be a returning of an infection.  Due to his history of diabetes, he may need a different antibiotic for this infection.  He is mildly hypertensive due to not taking  his medications this morning that he will take when he gets home.  No sign for Ludwig's angina or other deep tissue infection.  Advised to take antibiotics and follow-up with dentist.  Patient states that he will make an appointment in the week to follow-up.  Denies any other symptoms at this time.  Advised to return for any severe worsening symptoms.  Portions of this note were generated with Scientist, clinical (histocompatibility and immunogenetics)Dragon dictation software. Dictation errors may occur despite best attempts at proofreading.   Final Clinical Impressions(s) / ED Diagnoses   Final diagnoses:  Pain, dental    ED Discharge Orders        Ordered    clindamycin (CLEOCIN) 150 MG capsule  3 times daily     10/21/17 1120       Renatta Shrieves, [place], PA-C  10/21/17 1126    Mackuen, Cindee Salt, MD 10/22/17 1557

## 2017-10-21 NOTE — ED Triage Notes (Signed)
Pt reports he was seen here before for mass in roof of mouth and given antibiotics and states went down in size but never went away. And now increased in size.

## 2017-11-16 ENCOUNTER — Encounter (HOSPITAL_COMMUNITY): Payer: Self-pay | Admitting: Emergency Medicine

## 2017-11-16 ENCOUNTER — Emergency Department (HOSPITAL_COMMUNITY)
Admission: EM | Admit: 2017-11-16 | Discharge: 2017-11-16 | Disposition: A | Discharge status: Other Acute Inpatient Hospital | Payer: Self-pay | Attending: Emergency Medicine | Admitting: Emergency Medicine

## 2017-11-16 ENCOUNTER — Emergency Department (HOSPITAL_COMMUNITY): Payer: Self-pay

## 2017-11-16 DIAGNOSIS — E119 Type 2 diabetes mellitus without complications: Secondary | ICD-10-CM | POA: Insufficient documentation

## 2017-11-16 DIAGNOSIS — F17210 Nicotine dependence, cigarettes, uncomplicated: Secondary | ICD-10-CM | POA: Insufficient documentation

## 2017-11-16 DIAGNOSIS — Z794 Long term (current) use of insulin: Secondary | ICD-10-CM | POA: Insufficient documentation

## 2017-11-16 DIAGNOSIS — I10 Essential (primary) hypertension: Secondary | ICD-10-CM | POA: Insufficient documentation

## 2017-11-16 DIAGNOSIS — Z79899 Other long term (current) drug therapy: Secondary | ICD-10-CM | POA: Insufficient documentation

## 2017-11-16 DIAGNOSIS — M272 Inflammatory conditions of jaws: Secondary | ICD-10-CM | POA: Insufficient documentation

## 2017-11-16 DIAGNOSIS — K122 Cellulitis and abscess of mouth: Secondary | ICD-10-CM | POA: Insufficient documentation

## 2017-11-16 LAB — CBC WITH DIFFERENTIAL/PLATELET
Basophils Absolute: 0 K/uL (ref 0.0–0.1)
Basophils Relative: 0 %
Eosinophils Absolute: 0.1 K/uL (ref 0.0–0.7)
Eosinophils Relative: 1 %
HCT: 41.3 % (ref 39.0–52.0)
Hemoglobin: 14.6 g/dL (ref 13.0–17.0)
Lymphocytes Relative: 36 %
Lymphs Abs: 2.7 K/uL (ref 0.7–4.0)
MCH: 33.3 pg (ref 26.0–34.0)
MCHC: 35.4 g/dL (ref 30.0–36.0)
MCV: 94.1 fL (ref 78.0–100.0)
Monocytes Absolute: 0.6 K/uL (ref 0.1–1.0)
Monocytes Relative: 8 %
Neutro Abs: 4.2 K/uL (ref 1.7–7.7)
Neutrophils Relative %: 55 %
Platelets: 140 K/uL — ABNORMAL LOW (ref 150–400)
RBC: 4.39 MIL/uL (ref 4.22–5.81)
RDW: 12.5 % (ref 11.5–15.5)
WBC: 7.6 K/uL (ref 4.0–10.5)

## 2017-11-16 LAB — CBG MONITORING, ED: Glucose-Capillary: 184 mg/dL — ABNORMAL HIGH (ref 65–99)

## 2017-11-16 LAB — I-STAT CHEM 8, ED
BUN: 9 mg/dL (ref 6–20)
Calcium, Ion: 1.2 mmol/L (ref 1.15–1.40)
Chloride: 105 mmol/L (ref 101–111)
Creatinine, Ser: 0.9 mg/dL (ref 0.61–1.24)
Glucose, Bld: 239 mg/dL — ABNORMAL HIGH (ref 65–99)
HCT: 46 % (ref 39.0–52.0)
Hemoglobin: 15.6 g/dL (ref 13.0–17.0)
Potassium: 3.8 mmol/L (ref 3.5–5.1)
Sodium: 140 mmol/L (ref 135–145)
TCO2: 23 mmol/L (ref 22–32)

## 2017-11-16 MED ORDER — CLINDAMYCIN PHOSPHATE 600 MG/50ML IV SOLN
600.0000 mg | Freq: Once | INTRAVENOUS | Status: AC
  Administered 2017-11-16: 600 mg via INTRAVENOUS
  Filled 2017-11-16: qty 50

## 2017-11-16 MED ORDER — MORPHINE SULFATE (PF) 4 MG/ML IV SOLN
4.0000 mg | Freq: Once | INTRAVENOUS | Status: AC
  Administered 2017-11-16: 4 mg via INTRAVENOUS
  Filled 2017-11-16: qty 1

## 2017-11-16 MED ORDER — ONDANSETRON HCL 4 MG/2ML IJ SOLN
4.0000 mg | Freq: Once | INTRAMUSCULAR | Status: AC
  Administered 2017-11-16: 4 mg via INTRAVENOUS
  Filled 2017-11-16: qty 2

## 2017-11-16 MED ORDER — KETOROLAC TROMETHAMINE 30 MG/ML IJ SOLN
30.0000 mg | Freq: Once | INTRAMUSCULAR | Status: AC
  Administered 2017-11-16: 30 mg via INTRAVENOUS
  Filled 2017-11-16: qty 1

## 2017-11-16 MED ORDER — IOHEXOL 300 MG/ML  SOLN
75.0000 mL | Freq: Once | INTRAMUSCULAR | Status: AC | PRN
  Administered 2017-11-16: 75 mL via INTRAVENOUS

## 2017-11-16 MED ORDER — SODIUM CHLORIDE 0.9 % IV SOLN
Freq: Once | INTRAVENOUS | Status: AC
  Administered 2017-11-16: 16:00:00 via INTRAVENOUS

## 2017-11-16 NOTE — ED Triage Notes (Signed)
Pt repots that he has mass in his mouth to come back now for the 3 rd time. Took all the antibiotics that was given last time.

## 2017-11-16 NOTE — ED Notes (Signed)
CBG 187 

## 2017-11-16 NOTE — ED Provider Notes (Signed)
[facility]-EMERGENCY DEPT Provider Note   CSN: [id number] Arrival date & time: 11/16/17  1439     History   Chief Complaint Chief Complaint  Patient presents with  . mass in mouth    HPI Gary Henderson is a 52 y.o. male.  HPI   52 year old male with history of diabetes and hypertension presenting for evaluation of mouth discomfort.  Patient report for more than 2 months he has had pain to the roof of his mouth.  Pain is associate with swelling, described as sharp, throbbing and presents.  He has been evaluated for this condition several times in the past, this is his third visit in the past 2 months 4.  States each time he received antibiotic which did provide some relief for short amount of time and then symptoms return.  He was last seen approximately a month ago for this condition.  He was diagnosed with an abscess to the roof of his mouth and was prescribed clindamycin.  States that he took it for the full duration which did help but for the past week his symptoms came back.  He denies fever but does report feeling like his teeth are loose.  No trouble swallowing or neck pain.  No abnormal weight changes but does endorse occasional slight night sweats.  He is a smoker.  Admits to having diabetes and currently actively trying to manage it better with diet and exercise.  No prior history of cancer.  Past Medical History:  Diagnosis Date  . Diabetes mellitus without complication (HCC)   . Hypertension     There are no active problems to display for this patient.   History reviewed. No pertinent surgical history.      Home Medications    Prior to Admission medications   Medication Sig Start Date End Date Taking? Authorizing Provider  benzocaine (ORAJEL) 10 % mucosal gel Use as directed 1 application in the mouth or throat as needed for mouth pain. 09/03/17   Dartha Lodge, PA-C  docusate sodium (COLACE) 100 MG capsule Take 1 tablet once or twice daily  as needed for constipation while taking narcotic pain medicine 11/24/14   Loleta Rose, MD  ibuprofen (ADVIL,MOTRIN) 600 MG tablet Take 1 tablet (600 mg total) by mouth every 6 (six) hours as needed. 09/03/17   Dartha Lodge, PA-C  insulin glargine (LANTUS) 100 UNIT/ML injection Inject 60 Units into the skin daily.    [provider]  losartan (COZAAR) 50 MG tablet Take 50 mg by mouth daily.    [provider]  metFORMIN (GLUCOPHAGE) 500 MG tablet Take 500 mg by mouth 2 (two) times daily with a meal.    [provider]  oxyCODONE-acetaminophen (ROXICET) 5-325 MG per tablet Take 1-2 tablets by mouth every 4 (four) hours as needed for severe pain. 11/24/14   Loleta Rose, MD    Family History No family history on file.  Social History Social History   Tobacco Use  . Smoking status: Current Every Day Smoker    Packs/day: 1.00    Years: 20.00    Pack years: 20.00    Types: Cigarettes  . Smokeless tobacco: Never Used  Substance Use Topics  . Alcohol use: No  . Drug use: No     Allergies   Gabapentin and Lisinopril   Review of Systems Review of Systems  All other systems reviewed and are negative.    Physical Exam Updated Vital Signs BP (!) 146/90 (BP  Location: Right Arm)   Pulse 96   Temp 98.6 F (37 C) (Oral)   Resp 18   SpO2 100%   Physical Exam  Constitutional: He appears well-developed and well-nourished. No distress.  HENT:  Head: Atraumatic.  Mouth: Along the hard palate on the roof of the mouth, there is an area approximately 1 cm in diameter and 2 cm in length that is tender to palpation and mildly fluctuant without erythema.  Normal dentition without significant dental decay.  Eyes: Conjunctivae are normal.  Neck: Neck supple.  Neurological: He is alert.  Skin: No rash noted.  Psychiatric: He has a normal mood and affect.  Nursing note and vitals reviewed.    ED Treatments / Results  Labs (all labs ordered are listed, but  only abnormal results are displayed) Labs Reviewed  CBC WITH DIFFERENTIAL/PLATELET - Abnormal; Notable for the following components:      Result Value   Platelets 140 (*)    All other components within normal limits  I-STAT CHEM 8, ED - Abnormal; Notable for the following components:   Glucose, Bld 239 (*)    All other components within normal limits  CBG MONITORING, ED    EKG None  Radiology Ct Maxillofacial W Contrast  Result Date: 11/16/2017 CLINICAL DATA:  52 year old male with recurrent oral cavity mass. Previously treated with antibiotics. EXAM: CT MAXILLOFACIAL WITH CONTRAST TECHNIQUE: Multidetector CT imaging of the maxillofacial structures was performed with intravenous contrast. Multiplanar CT image reconstructions were also generated. CONTRAST:  75mL OMNIPAQUE IOHEXOL 300 MG/ML  SOLN COMPARISON:  Head CT without contrast 11/15/2010. Neck CT 06/18/2010. FINDINGS: Osseous: Carious bilateral bicuspid and molar dentition. The mandible is intact. Periapical lucency surrounding the maxillary incisors. At the left medial incisor periapical lucency communicates with a larger 10 millimeter area of lucency which tracks through the thickness of the left maxilla alveolar process at that level (see coronal image 22 and series 4, image 47) and is associated with overlying heterogeneous soft tissue thickening. See series 3 image 47. IV contrast was administered, and there is heterogeneous hypodensity within the area of soft tissue thickening suggesting suppuration or trace abscess (estimated volume would be less than 1 mL). Maxilla otherwise intact. The remainder of the soft palate appears within normal limits. The other oral cavity soft tissues appear within normal limits. Chronic right nasal bone fracture. The other facial bones appear intact. Central skull base is intact. Visible cervical vertebrae appear intact. Orbits: Intact orbital walls. Visualized orbit soft tissues are within normal limits.  Sinuses: Clear. Bilateral tympanic cavities and petrous apex air cells are clear. The visible mastoid air cells are well pneumatized, trace inferior left mastoid opacification. Soft tissues: Oral cavity soft tissue abnormality described above. Negative visible larynx, pharynx, parapharyngeal spaces, retropharyngeal space, sublingual space, submandibular spaces, parotid spaces, and masticator spaces. Major vascular structures in the neck and at the skull base appear patent. The right IJ is dominant. Upper cervical lymph nodes are within normal limits. Limited intracranial: Negative. IMPRESSION: Carious dentition with odontogenic infection of the left anterior soft palate (series 3, image 47) where a small rounded soft tissue mass appears related to purulence/microabscess. Furthermore, the associated bony erosion of the left maxilla there is suspicious for acute or chronic Osteomyelitis (series 9, image 22). Electronically Signed   By: Odessa Fleming M.D.   On: 11/16/2017 18:55    Procedures Procedures (including critical care time)  Medications Ordered in ED Medications  morphine 4 MG/ML injection 4 mg (4 mg  Intravenous Given 11/16/17 1618)  ondansetron (ZOFRAN) injection 4 mg (4 mg Intravenous Given 11/16/17 1618)  0.9 %  sodium chloride infusion ( Intravenous New Bag/Given 11/16/17 1611)  ketorolac (TORADOL) 30 MG/ML injection 30 mg (30 mg Intravenous Given 11/16/17 1727)  iohexol (OMNIPAQUE) 300 MG/ML solution 75 mL (75 mLs Intravenous Contrast Given 11/16/17 1738)  clindamycin (CLEOCIN) IVPB 600 mg (0 mg Intravenous Stopped 11/16/17 2000)     Initial Impression / Assessment and Plan / ED Course  I have reviewed the triage vital signs and the nursing notes.  Pertinent labs & imaging results that were available during my care of the patient were reviewed by me and considered in my medical decision making (see chart for details).     BP (!) 160/104 (BP Location: Right Arm)   Pulse 80   Temp 98.6 F (37  C) (Oral)   Resp 18   SpO2 100%    Final Clinical Impressions(s) / ED Diagnoses   Final diagnoses:  Abscess of maxilla  Osteomyelitis of maxilla    ED Discharge Orders    None     3:44 PM Patient with recurrent swelling to the roof of his mouth and had been treated with antibiotic for the past several visits.  Due to the fact that he is a smoker and having this recurrent problem, I will obtain a maxillofacial CT scan to rule out malignancy causing his symptoms.  Pain medication given.  8:16 PM Labs are reassuring.  Evidence of hypoglycemia with a CBG of 239, normal anion gap.  Maxillofacial CT scan demonstrate odontogenic infection of the left anterior soft palate with a small round soft tissue mass appears related to purulent/microabscess.  Furthermore, associated bony erosion of the left maxillary concerning for acute on chronic osteomyelitis.  In light of this finding, I have consulted our ENT specialist, Dr. Jenne Pane, who requests patient to be further managed by oral surgeon instead.  Since we do not have an oral surgeon available on call, I have used the PALS line and reach out to [facility]NT, Dr. Alton Revere who agrees to accept patient ER to ER for further management of his condition.  Care discussed with Dr. Silverio Lay.  Patient is stable for discharge.  Patient voiced understanding and agrees with plan.  Patient is currently receiving IV clindamycin.   Fayrene Helper, PA-C 11/16/17 2026    Samuel Jester, DO 11/18/17 1321

## 2018-01-20 ENCOUNTER — Encounter (HOSPITAL_COMMUNITY): Payer: Self-pay | Admitting: Emergency Medicine

## 2018-01-20 ENCOUNTER — Emergency Department (HOSPITAL_COMMUNITY): Payer: Self-pay

## 2018-01-20 ENCOUNTER — Emergency Department (HOSPITAL_COMMUNITY)
Admission: EM | Admit: 2018-01-20 | Discharge: 2018-01-20 | Disposition: A | Discharge status: Home / Self Care | Payer: Self-pay | Attending: Emergency Medicine | Admitting: Emergency Medicine

## 2018-01-20 ENCOUNTER — Other Ambulatory Visit: Payer: Self-pay

## 2018-01-20 DIAGNOSIS — E1165 Type 2 diabetes mellitus with hyperglycemia: Secondary | ICD-10-CM | POA: Insufficient documentation

## 2018-01-20 DIAGNOSIS — R079 Chest pain, unspecified: Secondary | ICD-10-CM

## 2018-01-20 DIAGNOSIS — F17210 Nicotine dependence, cigarettes, uncomplicated: Secondary | ICD-10-CM | POA: Insufficient documentation

## 2018-01-20 DIAGNOSIS — I10 Essential (primary) hypertension: Secondary | ICD-10-CM | POA: Insufficient documentation

## 2018-01-20 DIAGNOSIS — Z79899 Other long term (current) drug therapy: Secondary | ICD-10-CM | POA: Insufficient documentation

## 2018-01-20 DIAGNOSIS — R739 Hyperglycemia, unspecified: Secondary | ICD-10-CM

## 2018-01-20 DIAGNOSIS — R0789 Other chest pain: Secondary | ICD-10-CM | POA: Insufficient documentation

## 2018-01-20 DIAGNOSIS — R202 Paresthesia of skin: Secondary | ICD-10-CM | POA: Insufficient documentation

## 2018-01-20 DIAGNOSIS — Z794 Long term (current) use of insulin: Secondary | ICD-10-CM | POA: Insufficient documentation

## 2018-01-20 LAB — CBC
HCT: 49.4 % (ref 39.0–52.0)
Hemoglobin: 17.8 g/dL — ABNORMAL HIGH (ref 13.0–17.0)
MCH: 33.5 pg (ref 26.0–34.0)
MCHC: 36 g/dL (ref 30.0–36.0)
MCV: 93 fL (ref 78.0–100.0)
PLATELETS: 158 10*3/uL (ref 150–400)
RBC: 5.31 MIL/uL (ref 4.22–5.81)
RDW: 12.3 % (ref 11.5–15.5)
WBC: 7.9 10*3/uL (ref 4.0–10.5)

## 2018-01-20 LAB — D-DIMER, QUANTITATIVE: D-Dimer, Quant: <0.27 ug/mL-FEU (ref 0.00–0.50)

## 2018-01-20 LAB — I-STAT TROPONIN, ED
TROPONIN I, POC: 0 ng/mL (ref 0.00–0.08)
Troponin i, poc: 0 ng/mL (ref 0.00–0.08)

## 2018-01-20 LAB — BASIC METABOLIC PANEL
Anion gap: 15 (ref 5–15)
BUN: 23 mg/dL — ABNORMAL HIGH (ref 6–20)
CALCIUM: 9.5 mg/dL (ref 8.9–10.3)
CO2: 19 mmol/L — ABNORMAL LOW (ref 22–32)
CREATININE: 1.29 mg/dL — AB (ref 0.61–1.24)
Chloride: 96 mmol/L — ABNORMAL LOW (ref 98–111)
GLUCOSE: 518 mg/dL — AB (ref 70–99)
Potassium: 5.1 mmol/L (ref 3.5–5.1)
Sodium: 130 mmol/L — ABNORMAL LOW (ref 135–145)

## 2018-01-20 LAB — CBG MONITORING, ED: Glucose-Capillary: 360 mg/dL — ABNORMAL HIGH (ref 70–99)

## 2018-01-20 MED ORDER — SODIUM CHLORIDE 0.9 % IV BOLUS
1000.0000 mL | Freq: Once | INTRAVENOUS | Status: AC
  Administered 2018-01-20: 1000 mL via INTRAVENOUS

## 2018-01-20 MED ORDER — INSULIN ASPART 100 UNIT/ML ~~LOC~~ SOLN
10.0000 [IU] | Freq: Once | SUBCUTANEOUS | Status: AC
Start: 2018-01-20 — End: 2018-01-20
  Administered 2018-01-20: 10 [IU] via INTRAVENOUS
  Filled 2018-01-20: qty 1

## 2018-01-20 NOTE — ED Provider Notes (Signed)
[facility]-EMERGENCY DEPT Provider Note   CSN: [id number] Arrival date & time: 01/20/18  0222     History   Chief Complaint Chief Complaint  Patient presents with  . Chest Pain    HPI Gary Henderson is a 52 y.o. male.  Patient presents to the emergency department with chief complaint of chest pain.  He states his symptoms started yesterday.  He states that they come and go.  States that they have eased off now.  He reports feeling very fatigued and tired for the past several days.  He also reports tingling in his left leg and right arm for the past several days.  He denies any headache.  Denies any vision changes.  Denies slurred speech.  Denies any history of heart disease.  Denies any prior PE or DVT.  Denies any recent long travel, surgery, or immobilization.  The history is provided by the patient. No language interpreter was used.    Past Medical History:  Diagnosis Date  . Diabetes mellitus without complication (HCC)   . Hypertension     There are no active problems to display for this patient.   History reviewed. No pertinent surgical history.      Home Medications    Prior to Admission medications   Medication Sig Start Date End Date Taking? Authorizing Provider  atorvastatin (LIPITOR) 20 MG tablet Take 20 mg by mouth daily.    [provider]  benzocaine (ORAJEL) 10 % mucosal gel Use as directed 1 application in the mouth or throat as needed for mouth pain. Patient not taking: Reported on 11/16/2017 09/03/17   Dartha Lodge, PA-C  glipiZIDE (GLUCOTROL XL) 10 MG 24 hr tablet Take 10 mg by mouth daily with breakfast.    [provider]  ibuprofen (ADVIL,MOTRIN) 600 MG tablet Take 1 tablet (600 mg total) by mouth every 6 (six) hours as needed. Patient not taking: Reported on 11/16/2017 09/03/17   Dartha Lodge, PA-C  insulin glargine (LANTUS) 100 UNIT/ML injection Inject 80 Units into the skin daily.     [provider]  losartan (COZAAR) 50 MG tablet Take 50 mg by mouth daily.    [provider]  metFORMIN (GLUCOPHAGE) 500 MG tablet Take 500 mg by mouth 2 (two) times daily with a meal.    [provider]    Family History History reviewed. No pertinent family history.  Social History Social History   Tobacco Use  . Smoking status: Current Every Day Smoker    Packs/day: 1.00    Years: 20.00    Pack years: 20.00    Types: Cigarettes  . Smokeless tobacco: Never Used  Substance Use Topics  . Alcohol use: No  . Drug use: No     Allergies   Gabapentin and Lisinopril   Review of Systems Review of Systems  All other systems reviewed and are negative.    Physical Exam Updated Vital Signs BP (!) 144/96 (BP Location: Left Arm)   Pulse (!) 111   Temp 98.1 F (36.7 C) (Oral)   Resp 18   SpO2 100%   Physical Exam  Constitutional: He is oriented to person, place, and time. He appears well-developed and well-nourished.  HENT:  Head: Normocephalic and atraumatic.  Eyes: Pupils are equal, round, and reactive to light. Conjunctivae and EOM are normal. Right eye exhibits no discharge. Left eye exhibits no discharge. No scleral icterus.  Neck: Normal range of motion. Neck supple. No JVD  present.  Cardiovascular: Normal rate, regular rhythm and normal heart sounds. Exam reveals no gallop and no friction rub.  No murmur heard. Pulmonary/Chest: Effort normal and breath sounds normal. No respiratory distress. He has no wheezes. He has no rales. He exhibits no tenderness.  Abdominal: Soft. He exhibits no distension and no mass. There is no tenderness. There is no rebound and no guarding.  Musculoskeletal: Normal range of motion. He exhibits no edema or tenderness.  Neurological: He is alert and oriented to person, place, and time.  Skin: Skin is warm and dry.  Psychiatric: He has a normal mood and affect. His behavior is normal. Judgment and thought content normal.  Nursing note  and vitals reviewed.    ED Treatments / Results  Labs (all labs ordered are listed, but only abnormal results are displayed) Labs Reviewed  BASIC METABOLIC PANEL  CBC  D-DIMER, QUANTITATIVE (NOT AT [facility])  I-STAT TROPONIN, ED    EKG EKG Interpretation  Date/Time:  Sunday January 20 2018 02:32:40 EDT Ventricular Rate:  122 PR Interval:    QRS Duration: 98 QT Interval:  319 QTC Calculation: 455 R Axis:   76 Text Interpretation:  Sinus tachycardia Low voltage with right axis deviation Rate is faster Lateral T-wave inversions no longer present Reconfirmed by Paula LibraMolpus, John ([id number]) on 01/20/2018 2:38:52 AM   Radiology No results found.  Procedures Procedures (including critical care time)  Medications Ordered in ED Medications - No data to display   Initial Impression / Assessment and Plan / ED Course  I have reviewed the triage vital signs and the nursing notes.  Pertinent labs & imaging results that were available during my care of the patient were reviewed by me and considered in my medical decision making (see chart for details).     Patient with chest pain and fatigue.  He also reports tingling in his right upper extremity and left lower extremity.  He is noted to be tachycardic.  Will check d-dimer.  Troponin is negative.  Chest x-ray unremarkable.  Blood sugar is noted to be 518.  His K is 5.1 with moderate hemolysis.  I suspect that his hyperglycemia is the reason for his fatigue and tingling.  Will check delta troponin in 3 hours.  If negative, anticipate discharge to home.  We will treat hyperglycemia with fluids and insulin.  Glucose is trending down nicely.  Delta troponin is negative.  Patient is reassured.  Return precautions given.  Patient will follow-up with PCP.   Final Clinical Impressions(s) / ED Diagnoses   Final diagnoses:  Hyperglycemia  Chest pain, unspecified type    ED Discharge Orders    None       Roxy HorsemanBrowning, Jisella Ashenfelter, PA-C 01/20/18 [id number]     Molpus, Jonny RuizJohn, MD 01/20/18 [id number]

## 2018-01-20 NOTE — ED Triage Notes (Signed)
Pt reports chest pain onset x1 day radiates down left arm, denies SOB but admits to increased fatigue. Hx pack a day smoker >30 yrs

## 2018-08-18 ENCOUNTER — Encounter (HOSPITAL_COMMUNITY): Payer: Self-pay

## 2018-08-18 ENCOUNTER — Emergency Department (HOSPITAL_COMMUNITY)
Admission: EM | Admit: 2018-08-18 | Discharge: 2018-08-18 | Disposition: A | Discharge status: Home / Self Care | Payer: Self-pay | Attending: Emergency Medicine | Admitting: Emergency Medicine

## 2018-08-18 ENCOUNTER — Other Ambulatory Visit: Payer: Self-pay

## 2018-08-18 DIAGNOSIS — I10 Essential (primary) hypertension: Secondary | ICD-10-CM | POA: Insufficient documentation

## 2018-08-18 DIAGNOSIS — E1165 Type 2 diabetes mellitus with hyperglycemia: Secondary | ICD-10-CM | POA: Insufficient documentation

## 2018-08-18 DIAGNOSIS — Z7982 Long term (current) use of aspirin: Secondary | ICD-10-CM | POA: Insufficient documentation

## 2018-08-18 DIAGNOSIS — Z79899 Other long term (current) drug therapy: Secondary | ICD-10-CM | POA: Insufficient documentation

## 2018-08-18 DIAGNOSIS — Z794 Long term (current) use of insulin: Secondary | ICD-10-CM | POA: Insufficient documentation

## 2018-08-18 DIAGNOSIS — F17210 Nicotine dependence, cigarettes, uncomplicated: Secondary | ICD-10-CM | POA: Insufficient documentation

## 2018-08-18 DIAGNOSIS — R739 Hyperglycemia, unspecified: Secondary | ICD-10-CM

## 2018-08-18 HISTORY — DX: Patient's noncompliance with other medical treatment and regimen: Z91.19

## 2018-08-18 HISTORY — DX: Patient's noncompliance with other medical treatment and regimen due to unspecified reason: Z91.199

## 2018-08-18 LAB — BLOOD GAS, VENOUS
Acid-Base Excess: 0.1 mmol/L (ref 0.0–2.0)
Bicarbonate: 24.1 mmol/L (ref 20.0–28.0)
O2 Saturation: 82.5 %
PCO2 VEN: 39.2 mmHg — AB (ref 44.0–60.0)
PO2 VEN: 46.3 mmHg — AB (ref 32.0–45.0)
Patient temperature: 98.6
pH, Ven: 7.406 (ref 7.250–7.430)

## 2018-08-18 LAB — URINALYSIS, ROUTINE W REFLEX MICROSCOPIC
BACTERIA UA: NONE SEEN
Bilirubin Urine: NEGATIVE
Glucose, UA: >=500 mg/dL — AB
Hgb urine dipstick: NEGATIVE
Ketones, ur: 20 mg/dL — AB
LEUKOCYTES UA: NEGATIVE
Nitrite: NEGATIVE
Protein, ur: NEGATIVE mg/dL
Specific Gravity, Urine: 1.031 — ABNORMAL HIGH (ref 1.005–1.030)
pH: 5 (ref 5.0–8.0)

## 2018-08-18 LAB — CBC WITH DIFFERENTIAL/PLATELET
Abs Immature Granulocytes: 0.03 10*3/uL (ref 0.00–0.07)
Basophils Absolute: 0 10*3/uL (ref 0.0–0.1)
Basophils Relative: 0 %
Eosinophils Absolute: 0.1 10*3/uL (ref 0.0–0.5)
Eosinophils Relative: 1 %
HCT: 48.6 % (ref 39.0–52.0)
Hemoglobin: 16.8 g/dL (ref 13.0–17.0)
Immature Granulocytes: 0 %
Lymphocytes Relative: 37 %
Lymphs Abs: 3 10*3/uL (ref 0.7–4.0)
MCH: 32.6 pg (ref 26.0–34.0)
MCHC: 34.6 g/dL (ref 30.0–36.0)
MCV: 94.2 fL (ref 80.0–100.0)
Monocytes Absolute: 0.5 10*3/uL (ref 0.1–1.0)
Monocytes Relative: 6 %
Neutro Abs: 4.5 10*3/uL (ref 1.7–7.7)
Neutrophils Relative %: 56 %
Platelets: 177 10*3/uL (ref 150–400)
RBC: 5.16 MIL/uL (ref 4.22–5.81)
RDW: 11.9 % (ref 11.5–15.5)
WBC: 8.1 10*3/uL (ref 4.0–10.5)
nRBC: 0 % (ref 0.0–0.2)

## 2018-08-18 LAB — COMPREHENSIVE METABOLIC PANEL
ALT: 20 U/L (ref 0–44)
ANION GAP: 12 (ref 5–15)
AST: 17 U/L (ref 15–41)
Albumin: 4.1 g/dL (ref 3.5–5.0)
Alkaline Phosphatase: 90 U/L (ref 38–126)
BUN: 17 mg/dL (ref 6–20)
CO2: 23 mmol/L (ref 22–32)
Calcium: 9.6 mg/dL (ref 8.9–10.3)
Chloride: 99 mmol/L (ref 98–111)
Creatinine, Ser: 1.15 mg/dL (ref 0.61–1.24)
GFR calc Af Amer: >60 mL/min (ref >60)
GFR calc non Af Amer: >60 mL/min (ref >60)
Glucose, Bld: 447 mg/dL — ABNORMAL HIGH (ref 70–99)
Potassium: 4.3 mmol/L (ref 3.5–5.1)
SODIUM: 134 mmol/L — AB (ref 135–145)
Total Bilirubin: 1.1 mg/dL (ref 0.3–1.2)
Total Protein: 8 g/dL (ref 6.5–8.1)

## 2018-08-18 LAB — CBG MONITORING, ED
Glucose-Capillary: 498 mg/dL — ABNORMAL HIGH (ref 70–99)
Glucose-Capillary: 343 mg/dL — ABNORMAL HIGH (ref 70–99)
Glucose-Capillary: 264 mg/dL — ABNORMAL HIGH (ref 70–99)

## 2018-08-18 LAB — TROPONIN I: Troponin I: <0.03 ng/mL (ref <0.03)

## 2018-08-18 MED ORDER — METFORMIN HCL 500 MG PO TABS: 500.0000 mg | ORAL_TABLET | Freq: Two times a day (BID) | ORAL | 0 refills | Status: DC

## 2018-08-18 MED ORDER — INSULIN GLARGINE 100 UNIT/ML SOLOSTAR PEN: 80.0000 [IU] | PEN_INJECTOR | Freq: Every day | SUBCUTANEOUS | 0 refills | Status: AC

## 2018-08-18 MED ORDER — GLIPIZIDE ER 10 MG PO TB24: 10.0000 mg | ORAL_TABLET | Freq: Every day | ORAL | 0 refills | Status: DC

## 2018-08-18 MED ORDER — SODIUM CHLORIDE 0.9 % IV BOLUS
2000.0000 mL | Freq: Once | INTRAVENOUS | Status: AC
  Administered 2018-08-18: 2000 mL via INTRAVENOUS

## 2018-08-18 MED ORDER — INSULIN PEN NEEDLE 31G X 8 MM MISC: 1.0000 | Freq: Every day | 0 refills | Status: AC

## 2018-08-18 NOTE — Progress Notes (Signed)
CSW consulted for no medication and no doctor. CSW notified RNCM that patient needs assistance with finding a doctor and obtaining medications. RNCM agreed to follow up with patient. CSW signing off, RNCM to assist patient with doctor/medication needs. Please re-consult if new needs arise.   Celso Sickle, LCSWA Clinical Social Worker [facility] Cell#: [phone]

## 2018-08-18 NOTE — ED Triage Notes (Signed)
Pt states that he has been weak. Pt states he has also not been eating. Pt states he is a diabetic and is out of insulin, and has been for about 1.5 weeks.

## 2018-08-18 NOTE — ED Provider Notes (Signed)
[facility]   CSN: [id number] Arrival date & time: 08/18/18  1027     History   Chief Complaint Chief Complaint  Patient presents with  . Hyperglycemia    HPI Gary Henderson is a 53 y.o. male.  HPI Pt was seen at 1120. Per pt, c/o gradual onset and worsening of persistent generalized weakness for the past 2 to 3 weeks. Pt states he has not been checking his CBG's for the past 1 month, ran out of insulin 2 to 3 weeks ago. Pt states he has been "losing weight" and "thirsty." Denies CP/palpitations, no SOB/cough, no abd pain, no N/V/D, no back pain, no focal motor weakness, no tingling/numbness in extremities.    Past Medical History:  Diagnosis Date  . Diabetes mellitus without complication (HCC)   . Hypertension   . Noncompliance     There are no active problems to display for this patient.   History reviewed. No pertinent surgical history.      Home Medications    Prior to Admission medications   Medication Sig Start Date End Date Taking? Authorizing Provider  aspirin 81 MG chewable tablet Chew 81 mg by mouth daily.   Yes [provider]  atorvastatin (LIPITOR) 20 MG tablet Take 20 mg by mouth daily.   Yes [provider]  glipiZIDE (GLUCOTROL XL) 10 MG 24 hr tablet Take 10 mg by mouth daily with breakfast.   Yes [provider]  insulin glargine (LANTUS) 100 UNIT/ML injection Inject 82 Units into the skin daily.    Yes [provider]  losartan (COZAAR) 50 MG tablet Take 50 mg by mouth daily.   Yes [provider]  metFORMIN (GLUCOPHAGE) 500 MG tablet Take 500 mg by mouth 2 (two) times daily with a meal.   Yes [provider]  benzocaine (ORAJEL) 10 % mucosal gel Use as directed 1 application in the mouth or throat as needed for mouth pain. Patient not taking: Reported on 11/16/2017 09/03/17   Dartha LodgeFord, Kelsey N, PA-C  ibuprofen (ADVIL,MOTRIN) 600 MG tablet Take 1 tablet  (600 mg total) by mouth every 6 (six) hours as needed. Patient not taking: Reported on 11/16/2017 09/03/17   Dartha LodgeFord, Kelsey N, PA-C    Family History No family history on file.  Social History Social History   Tobacco Use  . Smoking status: Current Every Day Smoker    Packs/day: 1.00    Years: 20.00    Pack years: 20.00    Types: Cigarettes  . Smokeless tobacco: Never Used  Substance Use Topics  . Alcohol use: No  . Drug use: No     Allergies   Gabapentin and Lisinopril   Review of Systems Review of Systems ROS: Statement: All systems negative except as marked or noted in the HPI; Constitutional: Negative for fever and chills. +generalized weakness.; ; Eyes: Negative for eye pain, redness and discharge. ; ; ENMT: Negative for ear pain, hoarseness, nasal congestion, sinus pressure and sore throat. ; ; Cardiovascular: Negative for chest pain, palpitations, diaphoresis, dyspnea and peripheral edema. ; ; Respiratory: Negative for cough, wheezing and stridor. ; ; Gastrointestinal: Negative for nausea, vomiting, diarrhea, abdominal pain, blood in stool, hematemesis, jaundice and rectal bleeding. ; ; Genitourinary: Negative for dysuria, flank pain and hematuria. ; ; Musculoskeletal: Negative for back pain and neck pain. Negative for swelling and trauma.; ; Skin: Negative for pruritus, rash, abrasions, blisters, bruising and skin lesion.; ; Neuro: Negative for headache,  lightheadedness and neck stiffness. Negative for altered level of consciousness, altered mental status, extremity weakness, paresthesias, involuntary movement, seizure and syncope.       Physical Exam Updated Vital Signs BP (!) 160/91 (BP Location: Right Arm)   Pulse (!) 127   Temp 98.5 F (36.9 C) (Oral)   Resp 16   Ht 6' (1.829 m)   Wt 90.3 kg   SpO2 100%   BMI 26.99 kg/m   BP (!) 147/95   Pulse 93   Temp 98.5 F (36.9 C) (Oral)   Resp 16   Ht 6' (1.829 m)   Wt 90.3 kg   SpO2 99%   BMI 26.99 kg/m     Physical Exam 1125: Physical examination:  Nursing notes reviewed; Vital signs and O2 SAT reviewed;  Constitutional: Well developed, Well nourished, In no acute distress; Head:  Normocephalic, atraumatic; Eyes: EOMI, PERRL, No scleral icterus; ENMT: Mouth and pharynx normal, Mucous membranes dry; Neck: Supple, Full range of motion, No lymphadenopathy; Cardiovascular: Tachycardic rate and rhythm, No gallop; Respiratory: Breath sounds clear & equal bilaterally, No wheezes.  Speaking full sentences with ease, Normal respiratory effort/excursion; Chest: Nontender, Movement normal; Abdomen: Soft, Nontender, Nondistended, Normal bowel sounds; Genitourinary: No CVA tenderness; Extremities: Peripheral pulses normal, No tenderness, No edema, No calf edema or asymmetry.; Neuro: AA&Ox3, Major CN grossly intact.  Speech clear. No gross focal motor or sensory deficits in extremities.; Skin: Color normal, Warm, Dry.     ED Treatments / Results  Labs (all labs ordered are listed, but only abnormal results are displayed)   EKG EKG Interpretation  Date/Time:  Sunday August 18 2018 11:43:26 EST Ventricular Rate:  106 PR Interval:    QRS Duration: 87 QT Interval:  326 QTC Calculation: 433 R Axis:   52 Text Interpretation:  Sinus tachycardia Artifact When compared with ECG of 01/20/2018 No significant change was found Confirmed by Samuel JesterMcManus, Lyndsi Altic [phone]([id number]) on 08/18/2018 11:49:22 AM   Radiology   Procedures Procedures (including critical care time)  Medications Ordered in ED Medications  sodium chloride 0.9 % bolus 2,000 mL (2,000 mLs Intravenous New Bag/Given 08/18/18 1148)     Initial Impression / Assessment and Plan / ED Course  I have reviewed the triage vital signs and the nursing notes.  Pertinent labs & imaging results that were available during my care of the patient were reviewed by me and considered in my medical decision making (see chart for details).  MDM Reviewed: previous  chart, nursing note and vitals Reviewed previous: labs and ECG Interpretation: labs and ECG    Results for orders placed or performed during the hospital encounter of 08/18/18  Urinalysis, Routine w reflex microscopic  Result Value Ref Range   Color, Urine STRAW (A) YELLOW   APPearance CLEAR CLEAR   Specific Gravity, Urine 1.031 (H) 1.005 - 1.030   pH 5.0 5.0 - 8.0   Glucose, UA >=500 (A) NEGATIVE mg/dL   Hgb urine dipstick NEGATIVE NEGATIVE   Bilirubin Urine NEGATIVE NEGATIVE   Ketones, ur 20 (A) NEGATIVE mg/dL   Protein, ur NEGATIVE NEGATIVE mg/dL   Nitrite NEGATIVE NEGATIVE   Leukocytes, UA NEGATIVE NEGATIVE   WBC, UA 0-5 0 - 5 WBC/hpf   Bacteria, UA NONE SEEN NONE SEEN  Troponin I - Once  Result Value Ref Range   Troponin I <0.03 <0.03 ng/mL  CBC with Differential  Result Value Ref Range   WBC 8.1 4.0 - 10.5 K/uL   RBC 5.16 4.22 - 5.81 MIL/uL  Hemoglobin 16.8 13.0 - 17.0 g/dL   HCT 24.0 97.3 - 53.2 %   MCV 94.2 80.0 - 100.0 fL   MCH 32.6 26.0 - 34.0 pg   MCHC 34.6 30.0 - 36.0 g/dL   RDW 99.2 42.6 - 83.4 %   Platelets 177 150 - 400 K/uL   nRBC 0.0 0.0 - 0.2 %   Neutrophils Relative % 56 %   Neutro Abs 4.5 1.7 - 7.7 K/uL   Lymphocytes Relative 37 %   Lymphs Abs 3.0 0.7 - 4.0 K/uL   Monocytes Relative 6 %   Monocytes Absolute 0.5 0.1 - 1.0 K/uL   Eosinophils Relative 1 %   Eosinophils Absolute 0.1 0.0 - 0.5 K/uL   Basophils Relative 0 %   Basophils Absolute 0.0 0.0 - 0.1 K/uL   Immature Granulocytes 0 %   Abs Immature Granulocytes 0.03 0.00 - 0.07 K/uL  Comprehensive metabolic panel  Result Value Ref Range   Sodium 134 (L) 135 - 145 mmol/L   Potassium 4.3 3.5 - 5.1 mmol/L   Chloride 99 98 - 111 mmol/L   CO2 23 22 - 32 mmol/L   Glucose, Bld 447 (H) 70 - 99 mg/dL   BUN 17 6 - 20 mg/dL   Creatinine, Ser 1.96 0.61 - 1.24 mg/dL   Calcium 9.6 8.9 - 22.2 mg/dL   Total Protein 8.0 6.5 - 8.1 g/dL   Albumin 4.1 3.5 - 5.0 g/dL   AST 17 15 - 41 U/L   ALT 20 0 - 44  U/L   Alkaline Phosphatase 90 38 - 126 U/L   Total Bilirubin 1.1 0.3 - 1.2 mg/dL   GFR calc non Af Amer >60 >60 mL/min   GFR calc Af Amer >60 >60 mL/min   Anion gap 12 5 - 15  Blood gas, venous  Result Value Ref Range   pH, Ven 7.406 7.250 - 7.430   pCO2, Ven 39.2 (L) 44.0 - 60.0 mmHg   pO2, Ven 46.3 (H) 32.0 - 45.0 mmHg   Bicarbonate 24.1 20.0 - 28.0 mmol/L   Acid-Base Excess 0.1 0.0 - 2.0 mmol/L   O2 Saturation 82.5 %   Patient temperature 98.6    Collection site VEIN    Drawn by COLLECTED BY NURSE    Sample type VENOUS   CBG monitoring, ED  Result Value Ref Range   Glucose-Capillary 498 (H) 70 - 99 mg/dL  CBG monitoring, ED  Result Value Ref Range   Glucose-Capillary 343 (H) 70 - 99 mg/dL  CBG monitoring, ED  Result Value Ref Range   Glucose-Capillary 264 (H) 70 - 99 mg/dL    9798: CBG elevated, but pt is not acidotic with normal AG and pH. VS and CBG improved after IVF. Pt states he feels better and is ready to go home now. SW has evaluated pt re: PMD f/u and meds rx assistance. Dx and testing d/w pt.  Questions answered.  Verb understanding, agreeable to d/c home with outpt f/u.      Final Clinical Impressions(s) / ED Diagnoses   Final diagnoses:  None    ED Discharge Orders    None       Samuel Jester, DO 08/21/18 1848

## 2018-08-18 NOTE — Discharge Instructions (Addendum)
Take the prescriptions as directed.  Call your regular medical doctor tomorrow to schedule a follow up appointment within the next 3 days.  Return to the Emergency Department immediately sooner if worsening.  ° °

## 2018-08-18 NOTE — Care Management Note (Signed)
Case Management Note  Patient Details  Name: Gary Henderson MRN: [id number] Date of Birth: 04/04/1966  Subjective/Objective:    DM                Action/Plan: NCM spoke to pt and states he goes to [facility] in [place]. States he is seen on sliding scale and able to get meds at clinic. Provided pt with MATCH letter with $3 copay and utilization once per year. Provided pt with [facility] or [facility] that he can call to schedule a follow up appt to establish with local clinic. He lives in [place].   Expected Discharge Date:                  Expected Discharge Plan:  Home/Self Care  In-House Referral:  NA  Discharge planning Services  CM Consult, MATCH Program, Medication Assistance  Post Acute Care Choice:  NA Choice offered to:  NA  DME Arranged:  N/A DME Agency:  NA  HH Arranged:  NA HH Agency:  NA  Status of Service:  Completed, signed off  If discussed at Long Length of Stay Meetings, dates discussed:    Additional Comments:  Elliot Cousin, RN 08/18/2018, 3:12 PM

## 2018-08-18 NOTE — ED Notes (Signed)
He tells [place] he feels "better". He remains in no distress.

## 2018-08-19 LAB — URINE CULTURE: Culture: NO GROWTH

## 2019-01-29 ENCOUNTER — Encounter (HOSPITAL_COMMUNITY): Payer: Self-pay

## 2019-01-29 ENCOUNTER — Emergency Department (HOSPITAL_COMMUNITY)
Admission: EM | Admit: 2019-01-29 | Discharge: 2019-01-30 | Discharge status: Against Medical Advice | Payer: Self-pay | Attending: Emergency Medicine | Admitting: Emergency Medicine

## 2019-01-29 ENCOUNTER — Emergency Department (HOSPITAL_COMMUNITY): Payer: Self-pay

## 2019-01-29 DIAGNOSIS — R0789 Other chest pain: Secondary | ICD-10-CM | POA: Insufficient documentation

## 2019-01-29 DIAGNOSIS — E119 Type 2 diabetes mellitus without complications: Secondary | ICD-10-CM | POA: Insufficient documentation

## 2019-01-29 DIAGNOSIS — Z5320 Procedure and treatment not carried out because of patient's decision for unspecified reasons: Secondary | ICD-10-CM | POA: Insufficient documentation

## 2019-01-29 DIAGNOSIS — R079 Chest pain, unspecified: Secondary | ICD-10-CM

## 2019-01-29 DIAGNOSIS — Z79899 Other long term (current) drug therapy: Secondary | ICD-10-CM | POA: Insufficient documentation

## 2019-01-29 DIAGNOSIS — F17210 Nicotine dependence, cigarettes, uncomplicated: Secondary | ICD-10-CM | POA: Insufficient documentation

## 2019-01-29 DIAGNOSIS — M542 Cervicalgia: Secondary | ICD-10-CM | POA: Insufficient documentation

## 2019-01-29 DIAGNOSIS — I10 Essential (primary) hypertension: Secondary | ICD-10-CM | POA: Insufficient documentation

## 2019-01-29 DIAGNOSIS — Z794 Long term (current) use of insulin: Secondary | ICD-10-CM | POA: Insufficient documentation

## 2019-01-29 DIAGNOSIS — R61 Generalized hyperhidrosis: Secondary | ICD-10-CM | POA: Insufficient documentation

## 2019-01-29 LAB — BASIC METABOLIC PANEL
Anion gap: 9 (ref 5–15)
BUN: 11 mg/dL (ref 6–20)
CO2: 22 mmol/L (ref 22–32)
Calcium: 8.5 mg/dL — ABNORMAL LOW (ref 8.9–10.3)
Chloride: 107 mmol/L (ref 98–111)
Creatinine, Ser: 1.07 mg/dL (ref 0.61–1.24)
GFR calc Af Amer: >60 mL/min (ref >60)
GFR calc non Af Amer: >60 mL/min (ref >60)
Glucose, Bld: 236 mg/dL — ABNORMAL HIGH (ref 70–99)
Potassium: 3.9 mmol/L (ref 3.5–5.1)
Sodium: 138 mmol/L (ref 135–145)

## 2019-01-29 LAB — TROPONIN I (HIGH SENSITIVITY)
Troponin I (High Sensitivity): 9 ng/L (ref <18)
Troponin I (High Sensitivity): 10 ng/L (ref <18)

## 2019-01-29 LAB — CBC
HCT: 42.3 % (ref 39.0–52.0)
Hemoglobin: 14.5 g/dL (ref 13.0–17.0)
MCH: 32.5 pg (ref 26.0–34.0)
MCHC: 34.3 g/dL (ref 30.0–36.0)
MCV: 94.8 fL (ref 80.0–100.0)
Platelets: 179 10*3/uL (ref 150–400)
RBC: 4.46 MIL/uL (ref 4.22–5.81)
RDW: 13.3 % (ref 11.5–15.5)
WBC: 7.2 10*3/uL (ref 4.0–10.5)
nRBC: 0 % (ref 0.0–0.2)

## 2019-01-29 MED ORDER — SODIUM CHLORIDE 0.9% FLUSH: 3.0000 mL | Freq: Once | INTRAVENOUS | Status: DC

## 2019-01-29 NOTE — ED Triage Notes (Signed)
To triage via EMS.  Onset 1 week intermittant chest pain and headache.  Pt has been off Losartan x 2 weeks, not sure why he stopped it.  EMS BP 196/106 HR 90 CBG 147 Temp 96.8  EMS gave ASA 324 mg.  No NTG d/t headache.

## 2019-01-29 NOTE — ED Provider Notes (Signed)
[facility] Provider Note   CSN: [id number] Arrival date & time: 01/29/19  [id number]    History   Chief Complaint Chief Complaint  Patient presents with  . Chest Pain    HPI Gary Henderson is a 53 y.o. male with a history of diabetes mellitus and hypertension who presents to the emergency department by EMS with a chief complaint of chest pain.  The patient reports that he has been having intermittent episodes of chest pain for the last week and a half.  He reports that at approximately 16:15 that he had an episode of chest pain that lasted for an hour and a half.  He reports the chest pain was left-sided.  He characterized the pain as pins and needles and pressure-like.  He reports associated diaphoresis.  He reports that the pain radiated up the left side of his neck into his head.  He also notes that he has been having intermittent headaches for the last week and a half, but when the chest pain began earlier today, it caused his headache to worsen.   He was given ASA by EMS.  His systolic blood pressure was over 200 on arrival so he was not given nitroglycerin.  He reports that the episodes of chest pain and left-sided headache that he has had over the last week have not been as severe as the episode earlier today.  He reports that he began crying due to the severity of the pain.  He reports that with the episodes of chest pain over the last week that they began with exertion and only resolve when he not only sits, but lays down and lays completely still.  He reports that a week and a half ago he also suddenly stopped taking his prescription of losartan 50 mg twice daily.  He reports that he has the medication at home, he does stop taking it.  He is uncertain why.  He denies visual changes, dizziness, lightheadedness, nausea, vomiting, abdominal pain, back pain, palpitations, or leg swelling.  Personal medical history includes diabetes mellitus and  hypertension.  He is also a current every day smoker.  He reports a family history of cardiovascular disease, including his mother who had an MI in her mid to late 3350s and his grandfather.  He is not established with a cardiologist.     The history is provided by the patient. No language interpreter was used.    Past Medical History:  Diagnosis Date  . Diabetes mellitus without complication (HCC)   . Hypertension   . Noncompliance     There are no active problems to display for this patient.   History reviewed. No pertinent surgical history.      Home Medications    Prior to Admission medications   Medication Sig Start Date End Date Taking? Authorizing Provider  aspirin 81 MG chewable tablet Chew 81 mg by mouth daily.   Yes [provider]  atorvastatin (LIPITOR) 20 MG tablet Take 20 mg by mouth daily at 6 PM.    Yes [provider]  glipiZIDE (GLUCOTROL XL) 10 MG 24 hr tablet Take 1 tablet (10 mg total) by mouth daily with breakfast for 30 days. 08/18/18 01/30/28 Yes Samuel JesterMcManus, Kathleen, DO  Insulin Glargine (LANTUS) 100 UNIT/ML Solostar Pen Inject 80 Units into the skin daily for 30 days. Patient taking differently: Inject 82 Units into the skin at bedtime.  08/18/18 01/30/28 Yes Samuel JesterMcManus, Kathleen, DO  losartan (COZAAR) 50 MG  tablet Take 50 mg by mouth daily.   Yes [provider]  metFORMIN (GLUCOPHAGE) 500 MG tablet Take 1 tablet (500 mg total) by mouth 2 (two) times daily with a meal for 30 days. 08/18/18 01/30/28 Yes Francine Graven, DO  benzocaine (ORAJEL) 10 % mucosal gel Use as directed 1 application in the mouth or throat as needed for mouth pain. Patient not taking: Reported on 11/16/2017 09/03/17   Jacqlyn Larsen, PA-C  ibuprofen (ADVIL,MOTRIN) 600 MG tablet Take 1 tablet (600 mg total) by mouth every 6 (six) hours as needed. Patient not taking: Reported on 11/16/2017 09/03/17   Jacqlyn Larsen, PA-C  Insulin Pen Needle (AURORA PEN NEEDLES) 31G X 8 MM  MISC 1 each by Does not apply route daily. Can dispense generic for lantus solostar pen 08/18/18   Francine Graven, DO    Family History No family history on file.  Social History Social History   Tobacco Use  . Smoking status: Current Every Day Smoker    Packs/day: 1.00    Years: 20.00    Pack years: 20.00    Types: Cigarettes  . Smokeless tobacco: Never Used  Substance Use Topics  . Alcohol use: No  . Drug use: No     Allergies   Gabapentin and Lisinopril   Review of Systems Review of Systems  Constitutional: Positive for diaphoresis. Negative for appetite change, chills, fatigue and fever.  Eyes: Negative for visual disturbance.  Respiratory: Negative for shortness of breath.   Cardiovascular: Positive for chest pain. Negative for palpitations and leg swelling.  Gastrointestinal: Negative for abdominal pain, diarrhea, nausea and vomiting.  Genitourinary: Negative for dysuria.  Musculoskeletal: Positive for neck pain. Negative for back pain, gait problem, myalgias and neck stiffness.  Skin: Negative for rash.  Allergic/Immunologic: Negative for immunocompromised state.  Neurological: Positive for headaches. Negative for dizziness, syncope, weakness, light-headedness and numbness.  Psychiatric/Behavioral: Negative for confusion.   Physical Exam Updated Vital Signs BP (!) 189/107   Pulse 79   Resp (!) 22   SpO2 99%   Physical Exam Vitals signs and nursing note reviewed.  Constitutional:      General: He is not in acute distress.    Appearance: He is well-developed. He is not ill-appearing, toxic-appearing or diaphoretic.  HENT:     Head: Normocephalic.  Eyes:     General: No scleral icterus.    Extraocular Movements: Extraocular movements intact.     Conjunctiva/sclera: Conjunctivae normal.     Pupils: Pupils are equal, round, and reactive to light.  Neck:     Musculoskeletal: Normal range of motion and neck supple. No neck rigidity or muscular tenderness.      Vascular: No carotid bruit.  Cardiovascular:     Rate and Rhythm: Normal rate and regular rhythm.     Pulses: Normal pulses.     Heart sounds: Normal heart sounds. No murmur. No friction rub. No gallop.   Pulmonary:     Effort: Pulmonary effort is normal. No respiratory distress.     Breath sounds: No stridor. No wheezing, rhonchi or rales.  Chest:     Chest wall: No tenderness.  Abdominal:     General: There is no distension.     Palpations: Abdomen is soft. There is no mass.     Tenderness: There is no abdominal tenderness. There is no right CVA tenderness, left CVA tenderness, guarding or rebound.     Hernia: No hernia is present.  Lymphadenopathy:  Cervical: No cervical adenopathy.  Skin:    General: Skin is warm and dry.  Neurological:     Mental Status: He is alert.  Psychiatric:        Behavior: Behavior normal.      ED Treatments / Results  Labs (all labs ordered are listed, but only abnormal results are displayed) Labs Reviewed  BASIC METABOLIC PANEL - Abnormal; Notable for the following components:      Result Value   Glucose, Bld 236 (*)    Calcium 8.5 (*)    All other components within normal limits  CBC  TROPONIN I (HIGH SENSITIVITY)  TROPONIN I (HIGH SENSITIVITY)    EKG EKG Interpretation  Date/Time:  Wednesday January 29 2019 18:31:36 EDT Ventricular Rate:  89 PR Interval:  142 QRS Duration: 78 QT Interval:  364 QTC Calculation: 442 R Axis:   49 Text Interpretation:  Normal sinus rhythm Nonspecific T wave abnormality Abnormal ECG Interpretation limited secondary to artifact No significant change since last tracing Confirmed by Zadie RhineWickline, Donald ([id number]) on 01/29/2019 11:42:16 PM Also confirmed by Zadie RhineWickline, Donald ([id number]), editor Barbette Hairassel, Kerry [phone]  on 01/30/2019 7:33:37 AM   Radiology Dg Chest 2 View  Result Date: 01/29/2019 CLINICAL DATA:  Left-sided chest pain for several days EXAM: CHEST - 2 VIEW COMPARISON:  01/20/2018 FINDINGS: The heart  size and mediastinal contours are within normal limits. Both lungs are clear. The visualized skeletal structures are unremarkable. IMPRESSION: No active cardiopulmonary disease. Electronically Signed   By: Alcide CleverMark  Lukens M.D.   On: 01/29/2019 19:22    Procedures Procedures (including critical care time)  Medications Ordered in ED Medications  losartan (COZAAR) tablet 50 mg (50 mg Oral Given 01/30/19 0149)  hydrALAZINE (APRESOLINE) injection 5 mg (5 mg Intravenous Given 01/30/19 0309)     Initial Impression / Assessment and Plan / ED Course  I have reviewed the triage vital signs and the nursing notes.  Pertinent labs & imaging results that were available during my care of the patient were reviewed by me and considered in my medical decision making (see chart for details).        53 year old male with a history of diabetes mellitus and hypertension presenting with intermittent episodes of left-sided chest pain and left-sided headache over the last week and a half.  Of note, the patient also stopped his home losartan on his own 1.5 weeks ago.  [facility], he had an episode of chest pain associated with diaphoresis that radiated up the left side of his neck into his head and causes headache to worsen.  He reports that he was crying due to the severity of the pain.  He was given ASA by EMS.  On arrival to the ER, patient is hypertensive in the 190s/100s.  No hypoxia, tachycardia, or tachypnea.  Chest pain has been resolved.  Given his risk factors and history of present illness, HEART pathway score is 6.  However, he does not appear to have acute coronary syndrome given his evaluation today.  EKG unchanged from previous.  Labs are otherwise grossly unremarkable.  The patient remains hypertensive since arrival.  The patient was seen and independently evaluated by Dr. Bebe ShaggyWickline, attending physician.  Given his personal pathway, could consider discharge with outpatient follow-up versus admission  for observation.  After a shared decision-making conversation, the patient would prefer to improve his blood pressure and can be discharged home with outpatient cardiology follow-up.  The patient was given his home losartan followed by a dose  of hydralazine with minimal improvement in his blood pressure.  However, there were significant delays in medication being administer after orders were placed, and I was notified by nursing staff and other patients were not performing an examination that the patient wanted to leave AMA.  I went to the patient's room to discuss this with him further after he finished the patient's exam in another room, but the patient had already left the department.  I was unable to discuss with concerns of the provider and provide the possibility that this may worsen.  I was also unable to provide him with follow-up information.     Final Clinical Impressions(s) / ED Diagnoses   Final diagnoses:  Hypertension, unspecified type  Chest pain, unspecified type    ED Discharge Orders    None       Barkley BoardsMcDonald, Mia A, PA-C 01/30/19 0845    Zadie RhineWickline, Donald, MD 01/31/19 [phone]

## 2019-01-30 MED ORDER — HYDRALAZINE HCL 20 MG/ML IJ SOLN
5.0000 mg | Freq: Once | INTRAMUSCULAR | Status: AC
  Administered 2019-01-30: 5 mg via INTRAVENOUS
  Filled 2019-01-30: qty 1

## 2019-01-30 MED ORDER — LOSARTAN POTASSIUM 50 MG PO TABS
50.0000 mg | ORAL_TABLET | Freq: Once | ORAL | Status: AC
  Administered 2019-01-30: 50 mg via ORAL
  Filled 2019-01-30: qty 1

## 2019-01-30 NOTE — ED Notes (Signed)
Pt came out into hallway very upset states that he is not staying here another second and demanding that his IV be taken out, pt states that he will be more compliant with his medication and follow up with his PCP in the morning.

## 2019-01-30 NOTE — ED Provider Notes (Signed)
Patient seen/examined in the Emergency Department in conjunction with Midlevel Provider McDonald Patient reports chest pain and HTN, now improved Exam : awake/alert, no distress.  He is resting comfortably Plan: pt with high risk features for ACS.  He was given option of admission and is currently weighing his options    Ripley Fraise, MD 01/30/19 [phone]

## 2019-01-30 NOTE — ED Notes (Signed)
PA notified that pt. wants to leave AMA.

## 2019-01-31 ENCOUNTER — Emergency Department (HOSPITAL_COMMUNITY): Payer: Self-pay

## 2019-01-31 ENCOUNTER — Other Ambulatory Visit: Payer: Self-pay

## 2019-01-31 ENCOUNTER — Emergency Department (HOSPITAL_COMMUNITY)
Admission: EM | Admit: 2019-01-31 | Discharge: 2019-01-31 | Disposition: A | Discharge status: Home / Self Care | Payer: Self-pay | Attending: Emergency Medicine | Admitting: Emergency Medicine

## 2019-01-31 DIAGNOSIS — I10 Essential (primary) hypertension: Secondary | ICD-10-CM | POA: Insufficient documentation

## 2019-01-31 DIAGNOSIS — R519 Headache, unspecified: Secondary | ICD-10-CM

## 2019-01-31 DIAGNOSIS — Z79899 Other long term (current) drug therapy: Secondary | ICD-10-CM | POA: Insufficient documentation

## 2019-01-31 DIAGNOSIS — E119 Type 2 diabetes mellitus without complications: Secondary | ICD-10-CM | POA: Insufficient documentation

## 2019-01-31 DIAGNOSIS — R42 Dizziness and giddiness: Secondary | ICD-10-CM | POA: Insufficient documentation

## 2019-01-31 DIAGNOSIS — Z7984 Long term (current) use of oral hypoglycemic drugs: Secondary | ICD-10-CM | POA: Insufficient documentation

## 2019-01-31 DIAGNOSIS — Z7982 Long term (current) use of aspirin: Secondary | ICD-10-CM | POA: Insufficient documentation

## 2019-01-31 DIAGNOSIS — F17210 Nicotine dependence, cigarettes, uncomplicated: Secondary | ICD-10-CM | POA: Insufficient documentation

## 2019-01-31 DIAGNOSIS — R51 Headache: Secondary | ICD-10-CM | POA: Insufficient documentation

## 2019-01-31 MED ORDER — SODIUM CHLORIDE 0.9 % IV BOLUS (SEPSIS)
1000.0000 mL | Freq: Once | INTRAVENOUS | Status: AC
  Administered 2019-01-31: 03:00:00 1000 mL via INTRAVENOUS

## 2019-01-31 MED ORDER — DIPHENHYDRAMINE HCL 50 MG/ML IJ SOLN
25.0000 mg | Freq: Once | INTRAMUSCULAR | Status: AC
  Administered 2019-01-31: 25 mg via INTRAVENOUS
  Filled 2019-01-31: qty 1

## 2019-01-31 MED ORDER — KETOROLAC TROMETHAMINE 30 MG/ML IJ SOLN
30.0000 mg | Freq: Once | INTRAMUSCULAR | Status: AC
  Administered 2019-01-31: 30 mg via INTRAVENOUS
  Filled 2019-01-31: qty 1

## 2019-01-31 MED ORDER — METOCLOPRAMIDE HCL 5 MG/ML IJ SOLN
10.0000 mg | Freq: Once | INTRAMUSCULAR | Status: AC
  Administered 2019-01-31: 03:00:00 10 mg via INTRAVENOUS
  Filled 2019-01-31: qty 2

## 2019-01-31 NOTE — Discharge Instructions (Addendum)
Please take all of your medications as prescribed.   You may alternate Tylenol 1000 mg every 6 hours as needed for pain and Ibuprofen 800 mg every 8 hours as needed for pain.  Please take Ibuprofen with food.   Your head CT today was normal.  Your cardiac labs, EKG, chest x-ray yesterday were normal.  I recommend close follow-up with your outpatient doctor for monitoring of your blood pressure and outpatient follow-up with cardiology for a stress test given you do not have risk factors for heart disease.  If you develop chest pressure, shortness of breath, dizziness or feel like you are going to pass out, sudden sweating, please return to the emergency department immediately.  If you develop a sudden onset, worse headache of your life or numbness or weakness on one side of your body, facial droop, vision changes, speech changes, please return to the emergency department.

## 2019-01-31 NOTE — ED Provider Notes (Signed)
TIME SEEN: 2:44 AM  CHIEF COMPLAINT: Hypertension, headache  HPI: Patient is a 53 year old male with history of hypertension diabetes, hyperlipidemia, tobacco use who presents the emergency department with mild left-sided throbbing headache that has been ongoing for several days.  Was seen yesterday for the same and states at that time the headache was "severe" and he was also having chest pain.  At that time they recommended admission to the hospital but patient decided to leave because he states he had to wait a significant amount of time in the waiting room which made him upset.  He denies that he has had any chest pain since that time but states he called his sister who told him he needed to come back to the emergency department.  He states he is actually feeling pretty good right now.  He did not take anything for his headache.  He has had similar headaches before.  No photophobia, phonophobia, nausea or vomiting.  No head injury.  Not on blood thinners.  No numbness or focal weakness.  No vision changes.  No current chest, back or abdominal pain.  No shortness of breath.  Dates he has felt intermittently dizzy.  Denies vertigo.  States he takes losartan 50 mg daily at home as well as another blood pressure medication but he cannot recall the name.  Reports compliance with these medications.  Has a PCP for follow-up but has not yet contacted them about these headaches or high blood pressure.  ROS: See HPI Constitutional: no fever  Eyes: no drainage  ENT: no runny nose   Cardiovascular:  no chest pain  Resp: no SOB  GI: no vomiting GU: no dysuria Integumentary: no rash  Allergy: no hives  Musculoskeletal: no leg swelling  Neurological: no slurred speech ROS otherwise negative  PAST MEDICAL HISTORY/PAST SURGICAL HISTORY:  Past Medical History:  Diagnosis Date  . Diabetes mellitus without complication ([facility])   . Hypertension   . Noncompliance     MEDICATIONS:  Prior to Admission  medications   Medication Sig Start Date End Date Taking? Authorizing Provider  aspirin 81 MG chewable tablet Chew 81 mg by mouth daily.    [provider]  atorvastatin (LIPITOR) 20 MG tablet Take 20 mg by mouth daily at 6 PM.     [provider]  benzocaine (ORAJEL) 10 % mucosal gel Use as directed 1 application in the mouth or throat as needed for mouth pain. Patient not taking: Reported on 11/16/2017 09/03/17   Jacqlyn Larsen, PA-C  glipiZIDE (GLUCOTROL XL) 10 MG 24 hr tablet Take 1 tablet (10 mg total) by mouth daily with breakfast for 30 days. 08/18/18 01/30/28  Francine Graven, DO  ibuprofen (ADVIL,MOTRIN) 600 MG tablet Take 1 tablet (600 mg total) by mouth every 6 (six) hours as needed. Patient not taking: Reported on 11/16/2017 09/03/17   Jacqlyn Larsen, PA-C  Insulin Glargine (LANTUS) 100 UNIT/ML Solostar Pen Inject 80 Units into the skin daily for 30 days. Patient taking differently: Inject 82 Units into the skin at bedtime.  08/18/18 01/30/28  Francine Graven, DO  Insulin Pen Needle (AURORA PEN NEEDLES) 31G X 8 MM MISC 1 each by Does not apply route daily. Can dispense generic for lantus solostar pen 08/18/18   Francine Graven, DO  losartan (COZAAR) 50 MG tablet Take 50 mg by mouth daily.    [provider]  metFORMIN (GLUCOPHAGE) 500 MG tablet Take 1 tablet (500 mg total) by mouth 2 (two) times daily  with a meal for 30 days. 08/18/18 01/30/28  Samuel JesterMcManus, Kathleen, DO    ALLERGIES:  Allergies  Allergen Reactions  . Gabapentin Shortness Of Breath    sweating  . Lisinopril Swelling and Rash    SOCIAL HISTORY:  Social History   Tobacco Use  . Smoking status: Current Every Day Smoker    Packs/day: 1.00    Years: 20.00    Pack years: 20.00    Types: Cigarettes  . Smokeless tobacco: Never Used  Substance Use Topics  . Alcohol use: No    FAMILY HISTORY: No family history on file.  EXAM: BP (!) 167/97 (BP Location: Right Arm)   Pulse 97   Temp 98.8 F  (37.1 C) (Oral)   Resp 16   SpO2 98%  CONSTITUTIONAL: Alert and oriented and responds appropriately to questions. Well-appearing; well-nourished, smiling, laughing HEAD: Normocephalic EYES: Conjunctivae clear, pupils appear equal, EOMI ENT: normal nose; moist mucous membranes NECK: Supple, no meningismus, no nuchal rigidity, no LAD  CARD: RRR; S1 and S2 appreciated; no murmurs, no clicks, no rubs, no gallops RESP: Normal chest excursion without splinting or tachypnea; breath sounds clear and equal bilaterally; no wheezes, no rhonchi, no rales, no hypoxia or respiratory distress, speaking full sentences ABD/GI: Normal bowel sounds; non-distended; soft, non-tender, no rebound, no guarding, no peritoneal signs, no hepatosplenomegaly BACK:  The back appears normal and is non-tender to palpation, there is no CVA tenderness EXT: Normal ROM in all joints; non-tender to palpation; no edema; normal capillary refill; no cyanosis, no calf tenderness or swelling    SKIN: Normal color for age and race; warm; no rash NEURO: Moves all extremities equally, normal sensation diffusely, cranial nerves II through XII intact, normal speech, normal gait PSYCH: The patient's mood and manner are appropriate. Grooming and personal hygiene are appropriate.  MEDICAL DECISION MAKING: Patient here with complaints of headache.  States he has felt dizzy with this headache.  Denies that it is sudden onset or the worst of his life.  He has had similar headaches before but states he would like head imaging which I feel is reasonable given he was extremely hypertensive yesterday and is complaining of continued headaches.  Blood pressure is improved today and is 160s/90s.  He is neurologically intact.  Doubt stroke.  I do not think that if his head CT is normal he will need an lumbar puncture as my suspicion for subarachnoid is low.  Will give Reglan, Benadryl and IV fluids for his headache.  His EKG today shows no ischemic  abnormality.  He has not had any chest pain in over 24 hours and therefore do not feel he needs further cardiac work-up or admission at this time and he is comfortable with this plan.  Low suspicion for dissection.  Doubt PE.  ED PROGRESS: Patient denies significant improvement in headache after Reglan and Benadryl.  Will give Toradol.  Head CT shows no acute abnormality.  6:10 AM  Pt's headache is gone.  His blood pressure is 147/92.  Recommended follow-up with his primary care doctor physician for further monitoring of his blood pressure.  Recommended a stress test as an outpatient.  Will give cardiology follow-up as well.  Discussed return precautions.   At this time, I do not feel there is any life-threatening condition present. I have reviewed and discussed all results (EKG, imaging, lab, urine as appropriate) and exam findings with patient/family. I have reviewed nursing notes and appropriate previous records.  I feel the patient is  safe to be discharged home without further emergent workup and can continue workup as an outpatient as needed. Discussed usual and customary return precautions. Patient/family verbalize understanding and are comfortable with this plan.  Outpatient follow-up has been provided as needed. All questions have been answered.    EKG Interpretation  Date/Time:  Friday January 31 2019 02:36:48 EDT Ventricular Rate:  97 PR Interval:    QRS Duration: 82 QT Interval:  344 QTC Calculation: 437 R Axis:   51 Text Interpretation:  Sinus rhythm Borderline repolarization abnormality No significant change since last tracing Confirmed by Ward, Baxter HireKristen [phone] on 01/31/2019 2:52:01 AM         Ward, Layla MawKristen N, DO 01/31/19 [id number]

## 2019-01-31 NOTE — ED Triage Notes (Signed)
Per pt he was here yesterday with hypertension and headaches and left. Returning today with headache and dizziness. Pt said better than yesterday but worried about dizziness. Pt has a slight headache with blurred vision. Pt said he had not been taking his blood pressure medication for about 2 weeks now. Took it  Last night for first time again. Pressures now are 167/97

## 2019-02-03 ENCOUNTER — Encounter (HOSPITAL_COMMUNITY): Payer: Self-pay | Admitting: Emergency Medicine

## 2019-02-03 ENCOUNTER — Emergency Department (HOSPITAL_COMMUNITY): Payer: Self-pay

## 2019-02-03 ENCOUNTER — Encounter (HOSPITAL_COMMUNITY): Payer: Self-pay

## 2019-02-03 ENCOUNTER — Emergency Department (HOSPITAL_COMMUNITY)
Admission: EM | Admit: 2019-02-03 | Discharge: 2019-02-03 | Disposition: A | Discharge status: Home / Self Care | Payer: Self-pay | Attending: Emergency Medicine | Admitting: Emergency Medicine

## 2019-02-03 ENCOUNTER — Emergency Department (HOSPITAL_COMMUNITY)
Admission: EM | Admit: 2019-02-03 | Discharge: 2019-02-04 | Disposition: A | Discharge status: Home / Self Care | Payer: Self-pay | Attending: Emergency Medicine | Admitting: Emergency Medicine

## 2019-02-03 ENCOUNTER — Other Ambulatory Visit: Payer: Self-pay

## 2019-02-03 DIAGNOSIS — Z7984 Long term (current) use of oral hypoglycemic drugs: Secondary | ICD-10-CM | POA: Insufficient documentation

## 2019-02-03 DIAGNOSIS — Z7982 Long term (current) use of aspirin: Secondary | ICD-10-CM | POA: Insufficient documentation

## 2019-02-03 DIAGNOSIS — R51 Headache: Secondary | ICD-10-CM | POA: Insufficient documentation

## 2019-02-03 DIAGNOSIS — I10 Essential (primary) hypertension: Secondary | ICD-10-CM | POA: Insufficient documentation

## 2019-02-03 DIAGNOSIS — R519 Headache, unspecified: Secondary | ICD-10-CM

## 2019-02-03 DIAGNOSIS — Z716 Tobacco abuse counseling: Secondary | ICD-10-CM | POA: Insufficient documentation

## 2019-02-03 DIAGNOSIS — Z79899 Other long term (current) drug therapy: Secondary | ICD-10-CM | POA: Insufficient documentation

## 2019-02-03 DIAGNOSIS — E119 Type 2 diabetes mellitus without complications: Secondary | ICD-10-CM | POA: Insufficient documentation

## 2019-02-03 DIAGNOSIS — Z794 Long term (current) use of insulin: Secondary | ICD-10-CM | POA: Insufficient documentation

## 2019-02-03 DIAGNOSIS — F17210 Nicotine dependence, cigarettes, uncomplicated: Secondary | ICD-10-CM | POA: Insufficient documentation

## 2019-02-03 DIAGNOSIS — R42 Dizziness and giddiness: Secondary | ICD-10-CM | POA: Insufficient documentation

## 2019-02-03 LAB — URINALYSIS, ROUTINE W REFLEX MICROSCOPIC
Bilirubin Urine: NEGATIVE
Glucose, UA: NEGATIVE mg/dL
Hgb urine dipstick: NEGATIVE
Ketones, ur: NEGATIVE mg/dL
Leukocytes,Ua: NEGATIVE
Nitrite: NEGATIVE
Protein, ur: NEGATIVE mg/dL
Specific Gravity, Urine: 1.029 (ref 1.005–1.030)
pH: 5 (ref 5.0–8.0)

## 2019-02-03 LAB — BASIC METABOLIC PANEL
Anion gap: 9 (ref 5–15)
BUN: 19 mg/dL (ref 6–20)
CO2: 23 mmol/L (ref 22–32)
Calcium: 9.1 mg/dL (ref 8.9–10.3)
Chloride: 106 mmol/L (ref 98–111)
Creatinine, Ser: 1.16 mg/dL (ref 0.61–1.24)
GFR calc Af Amer: >60 mL/min (ref >60)
GFR calc non Af Amer: >60 mL/min (ref >60)
Glucose, Bld: 182 mg/dL — ABNORMAL HIGH (ref 70–99)
Potassium: 3.2 mmol/L — ABNORMAL LOW (ref 3.5–5.1)
Sodium: 138 mmol/L (ref 135–145)

## 2019-02-03 LAB — CBC
HCT: 42.9 % (ref 39.0–52.0)
Hemoglobin: 15 g/dL (ref 13.0–17.0)
MCH: 33 pg (ref 26.0–34.0)
MCHC: 35 g/dL (ref 30.0–36.0)
MCV: 94.5 fL (ref 80.0–100.0)
Platelets: 200 10*3/uL (ref 150–400)
RBC: 4.54 MIL/uL (ref 4.22–5.81)
RDW: 13.2 % (ref 11.5–15.5)
WBC: 6.8 10*3/uL (ref 4.0–10.5)
nRBC: 0 % (ref 0.0–0.2)

## 2019-02-03 LAB — CBG MONITORING, ED: Glucose-Capillary: 224 mg/dL — ABNORMAL HIGH (ref 70–99)

## 2019-02-03 MED ORDER — SODIUM CHLORIDE 0.9% FLUSH
3.0000 mL | Freq: Once | INTRAVENOUS | Status: AC
  Administered 2019-02-03: 3 mL via INTRAVENOUS

## 2019-02-03 MED ORDER — DIPHENHYDRAMINE HCL 50 MG/ML IJ SOLN
25.0000 mg | Freq: Once | INTRAMUSCULAR | Status: AC
  Administered 2019-02-03: 25 mg via INTRAVENOUS
  Filled 2019-02-03: qty 1

## 2019-02-03 MED ORDER — SODIUM CHLORIDE 0.9 % IV BOLUS
500.0000 mL | Freq: Once | INTRAVENOUS | Status: AC
  Administered 2019-02-03: 500 mL via INTRAVENOUS

## 2019-02-03 MED ORDER — METOCLOPRAMIDE HCL 5 MG/ML IJ SOLN
10.0000 mg | Freq: Once | INTRAMUSCULAR | Status: AC
  Administered 2019-02-03: 10 mg via INTRAVENOUS
  Filled 2019-02-03: qty 2

## 2019-02-03 MED ORDER — POTASSIUM CHLORIDE CRYS ER 20 MEQ PO TBCR
40.0000 meq | EXTENDED_RELEASE_TABLET | Freq: Once | ORAL | Status: AC
  Administered 2019-02-03: 40 meq via ORAL
  Filled 2019-02-03: qty 2

## 2019-02-03 NOTE — ED Notes (Signed)
Patient Gary Henderson (sister) asked when patient is able to have him call her  [phone]

## 2019-02-03 NOTE — ED Notes (Signed)
Patient Gary Henderson (sister) asked when patient is able to have him call her  [phone]

## 2019-02-03 NOTE — ED Notes (Signed)
Patient verbalizes understanding of discharge instructions. Opportunity for questioning and answers were provided. Armband removed by staff, pt discharged from ED.  Ambulatory by self   

## 2019-02-03 NOTE — ED Provider Notes (Signed)
[facility]ENC[facility] Provider Note   CSN: [id number] Arrival date & time: 02/03/19  1040    History   Chief Complaint Chief Complaint  Patient presents with  . Dizziness  . Headache    HPI Gary Henderson is a 53 y.o. male.     The history is provided by the patient and medical records. No language interpreter was used.  Dizziness Associated symptoms: headaches   Headache Associated symptoms: dizziness    Gary Henderson is a 53 y.o. male who presents to the Emergency Department complaining of dizziness and headache. He has a history of hypertension, diabetes and has been experiencing recurrent episodes of dizziness and headache for the last several weeks. He was seen in the emergency department for similar symptoms a few days ago and his symptoms completely resolved and he went home. He states that he is been compliant with his medications since ED discharge. Today he was feeling well and went to the grocery and when he was walking and he felt unsteady on his feet. He continued to shop and then he developed a severe, sudden onset throbbing left sided headache. He went home and tried to lay down but his headache persisted and he came into the emergency department for further evaluation. Dizziness is described as and unsteadiness when going to walk. He denies any fevers, nausea, vomiting, chest pain, shortness of breath, numbness, weakness. He states that today's headache is similar to priors. Past Medical History:  Diagnosis Date  . Diabetes mellitus without complication (HCC)   . Hypertension   . Noncompliance     There are no active problems to display for this patient.   History reviewed. No pertinent surgical history.      Home Medications    Prior to Admission medications   Medication Sig Start Date End Date Taking? Authorizing Provider  aspirin 81 MG chewable tablet Chew 81 mg by mouth daily.    [provider]  atorvastatin  (LIPITOR) 20 MG tablet Take 20 mg by mouth daily at 6 PM.     [provider]  benzocaine (ORAJEL) 10 % mucosal gel Use as directed 1 application in the mouth or throat as needed for mouth pain. Patient not taking: Reported on 11/16/2017 09/03/17   Dartha LodgeFord, Kelsey N, PA-C  glipiZIDE (GLUCOTROL XL) 10 MG 24 hr tablet Take 1 tablet (10 mg total) by mouth daily with breakfast for 30 days. 08/18/18 01/30/28  Samuel JesterMcManus, Kathleen, DO  ibuprofen (ADVIL,MOTRIN) 600 MG tablet Take 1 tablet (600 mg total) by mouth every 6 (six) hours as needed. Patient not taking: Reported on 11/16/2017 09/03/17   Dartha LodgeFord, Kelsey N, PA-C  Insulin Glargine (LANTUS) 100 UNIT/ML Solostar Pen Inject 80 Units into the skin daily for 30 days. Patient taking differently: Inject 82 Units into the skin at bedtime.  08/18/18 01/30/28  Samuel JesterMcManus, Kathleen, DO  Insulin Pen Needle (AURORA PEN NEEDLES) 31G X 8 MM MISC 1 each by Does not apply route daily. Can dispense generic for lantus solostar pen 08/18/18   Samuel JesterMcManus, Kathleen, DO  losartan (COZAAR) 50 MG tablet Take 50 mg by mouth daily.    [provider]  metFORMIN (GLUCOPHAGE) 500 MG tablet Take 1 tablet (500 mg total) by mouth 2 (two) times daily with a meal for 30 days. 08/18/18 01/30/28  Samuel JesterMcManus, Kathleen, DO    Family History No family history on file.  Social History Social History   Tobacco Use  . Smoking status: Current  Every Day Smoker    Packs/day: 1.00    Years: 20.00    Pack years: 20.00    Types: Cigarettes  . Smokeless tobacco: Never Used  Substance Use Topics  . Alcohol use: No  . Drug use: No     Allergies   Gabapentin and Lisinopril   Review of Systems Review of Systems  Neurological: Positive for dizziness and headaches.  All other systems reviewed and are negative.    Physical Exam Updated Vital Signs BP (!) 146/80   Pulse 95   Resp (!) 21   Ht 6' (1.829 m)   Wt 99.8 kg   SpO2 99%   BMI 29.84 kg/m   Physical Exam Vitals signs and  nursing note reviewed.  Constitutional:      Appearance: He is well-developed.  HENT:     Head: Normocephalic and atraumatic.  Cardiovascular:     Rate and Rhythm: Normal rate and regular rhythm.     Heart sounds: No murmur.  Pulmonary:     Effort: Pulmonary effort is normal. No respiratory distress.     Breath sounds: Normal breath sounds.  Abdominal:     Palpations: Abdomen is soft.     Tenderness: There is no abdominal tenderness. There is no guarding or rebound.  Musculoskeletal:        General: No tenderness.  Skin:    General: Skin is warm and dry.  Neurological:     Mental Status: He is alert and oriented to person, place, and time.     Comments: Pupils equal round and reactive. EOMI. No asymmetry of facial movements. Visual fields are grossly intact. No pronator drift. Five out of five strength in all four extremities with sensation to light touch intact in all four extremities. No ataxia on finger to nose bilaterally.  Psychiatric:        Mood and Affect: Mood normal.        Behavior: Behavior normal.      ED Treatments / Results  Labs (all labs ordered are listed, but only abnormal results are displayed) Labs Reviewed  BASIC METABOLIC PANEL - Abnormal; Notable for the following components:      Result Value   Potassium 3.2 (*)    Glucose, Bld 182 (*)    All other components within normal limits  CBG MONITORING, ED - Abnormal; Notable for the following components:   Glucose-Capillary 224 (*)    All other components within normal limits  CBC  URINALYSIS, ROUTINE W REFLEX MICROSCOPIC    EKG EKG Interpretation  Date/Time:  Monday February 03 2019 10:54:49 EDT Ventricular Rate:  105 PR Interval:  132 QRS Duration: 78 QT Interval:  358 QTC Calculation: 473 R Axis:   34 Text Interpretation:  Sinus tachycardia Nonspecific T wave abnormality Abnormal ECG Although rate has increased No significant change since last tracing Confirmed by Tilden Fossaees, Cambry Spampinato [phone]([id number]) on  02/03/2019 11:24:38 AM   Radiology No results found.  Procedures Procedures (including critical care time)  Medications Ordered in ED Medications  sodium chloride flush (NS) 0.9 % injection 3 mL (3 mLs Intravenous Given 02/03/19 1206)  sodium chloride 0.9 % bolus 500 mL (500 mLs Intravenous New Bag/Given 02/03/19 1210)  potassium chloride SA (K-DUR) CR tablet 40 mEq (40 mEq Oral Given 02/03/19 1205)  metoCLOPramide (REGLAN) injection 10 mg (10 mg Intravenous Given 02/03/19 1210)  diphenhydrAMINE (BENADRYL) injection 25 mg (25 mg Intravenous Given 02/03/19 1209)     Initial Impression / Assessment and Plan /  ED Course  I have reviewed the triage vital signs and the nursing notes.  Pertinent labs & imaging results that were available during my care of the patient were reviewed by me and considered in my medical decision making (see chart for details).        Patient with history of hypertension and diabetes here for evaluation of recurrent dizziness, headache. He is well appearing on evaluation with no focal neurologic deficits. Given recurrent vertiginous symptoms are recommended MRI to evaluate for possible occult CVA. He was treated with migraine cocktail in the emergency department. On repeat assessment his headache is resolved and he declines MRI. Discussed with patient reasons for obtaining MRI and he continues to not wish to stay. Plan to discharge home with close PCP follow-up, he has an appointment within the next week. Discussed home care and return precautions.  Final Clinical Impressions(s) / ED Diagnoses   Final diagnoses:  Dizziness  Bad headache    ED Discharge Orders    None       Quintella Reichert, MD 02/03/19 1256

## 2019-02-03 NOTE — ED Notes (Signed)
Patient Gary Henderson (sister) asked when patient is able to have him call her  [phone]

## 2019-02-03 NOTE — ED Triage Notes (Signed)
Pt. Stated, Gary Henderson been dizzy and  headache for 3 weeks.

## 2019-02-03 NOTE — ED Triage Notes (Signed)
[facility]- patient came by EMS from home with complaint of headache x2 weeks. He has been seen here for same and was offered CT scan but refused. Pain returned today and now patient wants to have CT scan done.   190/110- hx of hypertension 92 HR 98% RA 68 cbg 97.5 temp

## 2019-02-04 MED ORDER — VARENICLINE TARTRATE 0.5 MG PO TABS: 0.5000 mg | ORAL_TABLET | Freq: Two times a day (BID) | ORAL | 0 refills | Status: DC

## 2019-02-04 MED ORDER — KETOROLAC TROMETHAMINE 60 MG/2ML IM SOLN
30.0000 mg | Freq: Once | INTRAMUSCULAR | Status: AC
  Administered 2019-02-04: 06:00:00 30 mg via INTRAMUSCULAR
  Filled 2019-02-04: qty 2

## 2019-02-04 MED ORDER — KETOROLAC TROMETHAMINE 10 MG PO TABS: 10.0000 mg | ORAL_TABLET | Freq: Two times a day (BID) | ORAL | 0 refills | Status: DC | PRN

## 2019-02-04 MED ORDER — BUTALBITAL-APAP-CAFFEINE 50-325-40 MG PO TABS: 1.0000 | ORAL_TABLET | Freq: Four times a day (QID) | ORAL | 0 refills | Status: AC | PRN

## 2019-02-04 NOTE — ED Notes (Signed)
Patient verbalizes understanding of discharge instructions. Opportunity for questioning and answers were provided. Armband removed by staff, pt discharged from ED ambulatory.   

## 2019-02-05 NOTE — ED Provider Notes (Signed)
[facility] Provider Note   CSN: [id number] Arrival date & time: 02/03/19  1931    History   Chief Complaint Chief Complaint  Patient presents with  . Headache    HPI Gary Henderson is a 53 y.o. male.      Headache Pain location:  Generalized Quality:  Dull Radiates to:  Does not radiate Severity currently:  5/10 Onset quality:  Unable to specify Timing:  Constant Chronicity:  Recurrent Similar to prior headaches: yes   Relieved by:  None tried Worsened by:  Nothing Ineffective treatments:  None tried   Past Medical History:  Diagnosis Date  . Diabetes mellitus without complication (HCC)   . Hypertension   . Noncompliance     There are no active problems to display for this patient.   History reviewed. No pertinent surgical history.      Home Medications    Prior to Admission medications   Medication Sig Start Date End Date Taking? Authorizing Provider  aspirin 81 MG chewable tablet Chew 81 mg by mouth daily.   Yes [provider]  atorvastatin (LIPITOR) 20 MG tablet Take 20 mg by mouth daily at 6 PM.    Yes [provider]  glipiZIDE (GLUCOTROL XL) 10 MG 24 hr tablet Take 1 tablet (10 mg total) by mouth daily with breakfast for 30 days. 08/18/18 01/30/28 Yes Samuel JesterMcManus, Kathleen, DO  Insulin Glargine (LANTUS) 100 UNIT/ML Solostar Pen Inject 80 Units into the skin daily for 30 days. Patient taking differently: Inject 82 Units into the skin at bedtime.  08/18/18 01/30/28 Yes Samuel JesterMcManus, Kathleen, DO  losartan (COZAAR) 50 MG tablet Take 50 mg by mouth daily.   Yes [provider]  metFORMIN (GLUCOPHAGE) 500 MG tablet Take 1 tablet (500 mg total) by mouth 2 (two) times daily with a meal for 30 days. 08/18/18 01/30/28 Yes Samuel JesterMcManus, Kathleen, DO  butalbital-acetaminophen-caffeine (FIORICET) [phone] MG tablet Take 1-2 tablets by mouth every 6 (six) hours as needed for headache (if toradol doesn't work).  02/04/19 02/04/20  Kayson Tasker, Barbara CowerJason, MD  Insulin Pen Needle (AURORA PEN NEEDLES) 31G X 8 MM MISC 1 each by Does not apply route daily. Can dispense generic for lantus solostar pen 08/18/18   Samuel JesterMcManus, Kathleen, DO  ketorolac (TORADOL) 10 MG tablet Take 1 tablet (10 mg total) by mouth every 12 (twelve) hours as needed (severe headache). 02/04/19   Itali Mckendry, Barbara CowerJason, MD  varenicline (CHANTIX) 0.5 MG tablet Take 1 tablet (0.5 mg total) by mouth 2 (two) times daily. 02/04/19   Ravis Herne, Barbara CowerJason, MD    Family History History reviewed. No pertinent family history.  Social History Social History   Tobacco Use  . Smoking status: Current Every Day Smoker    Packs/day: 1.00    Years: 20.00    Pack years: 20.00    Types: Cigarettes  . Smokeless tobacco: Never Used  Substance Use Topics  . Alcohol use: No  . Drug use: No     Allergies   Gabapentin and Lisinopril   Review of Systems Review of Systems  Neurological: Positive for headaches.  All other systems reviewed and are negative.    Physical Exam Updated Vital Signs BP (!) 145/103 (BP Location: Left Arm)   Pulse (!) 101   Temp 98.3 F (36.8 C) (Oral)   Resp 16   SpO2 100%   Physical Exam Vitals signs and nursing note reviewed.  Constitutional:      Appearance: He is well-developed.  HENT:     Head: Normocephalic and atraumatic.     Mouth/Throat:     Mouth: Mucous membranes are dry.     Pharynx: Oropharynx is clear.  Eyes:     General: No visual field deficit.    Conjunctiva/sclera: Conjunctivae normal.  Neck:     Musculoskeletal: Normal range of motion.  Cardiovascular:     Rate and Rhythm: Normal rate.  Pulmonary:     Effort: Pulmonary effort is normal. No respiratory distress.  Abdominal:     General: Abdomen is flat. There is no distension.  Musculoskeletal: Normal range of motion.  Skin:    General: Skin is warm and dry.  Neurological:     Mental Status: He is alert.     GCS: GCS eye subscore is 4. GCS verbal subscore  is 5. GCS motor subscore is 6.     Cranial Nerves: No cranial nerve deficit, dysarthria or facial asymmetry.     Sensory: No sensory deficit.     Motor: No weakness.     Coordination: Coordination normal.     Gait: Gait normal.      ED Treatments / Results  Labs (all labs ordered are listed, but only abnormal results are displayed) Labs Reviewed - No data to display  EKG None  Radiology Ct Head Wo Contrast  Result Date: 02/03/2019 CLINICAL DATA:  Headache, acute EXAM: CT HEAD WITHOUT CONTRAST TECHNIQUE: Contiguous axial images were obtained from the base of the skull through the vertex without intravenous contrast. COMPARISON:  January 31, 2019 FINDINGS: Brain: No evidence of acute territorial infarction, hemorrhage, hydrocephalus,extra-axial collection or mass lesion/mass effect. Normal gray-white differentiation. Ventricles are normal in size and contour. Vascular: No hyperdense vessel or unexpected calcification. Skull: The skull is intact. No blastic or lytic lesions identified. Sinuses/Orbits: There is minimal left maxillary sinus mucosal thickening. The orbits and globes intact. Other: None IMPRESSION: No acute intracranial abnormality. Electronically Signed   By: Jonna ClarkBindu  Avutu M.D.   On: 02/03/2019 23:23    Procedures Procedures (including critical care time)   Counseled patient for approximately 6 minutes regarding smoking cessation. Discussed risks of smoking and how they applied and affected their visit here today. Patient is ready to quit at this time, and will follow up with their primary doctor when they run out of chantix.   CPT code: [id number]: intermediate counseling for smoking cessation   Medications Ordered in ED Medications  ketorolac (TORADOL) injection 30 mg (30 mg Intramuscular Given 02/04/19 0544)     Initial Impression / Assessment and Plan / ED Course  I have reviewed the triage vital signs and the nursing notes.  Pertinent labs & imaging results that were  available during my care of the patient were reviewed by me and considered in my medical decision making (see chart for details).        HA improved with toradol. CT negative. Neuro exam normal. Given Rx for HA meds and encouraged to fu w/ neurology.  Will also attempt to quit smoking, chantix initiated.  PCP follow up for BP control.   Final Clinical Impressions(s) / ED Diagnoses   Final diagnoses:  Nonintractable headache, unspecified chronicity pattern, unspecified headache type  Encounter for smoking cessation counseling  Hypertension, unspecified type    ED Discharge Orders         Ordered    ketorolac (TORADOL) 10 MG tablet  Every 12 hours PRN     02/04/19 0534    butalbital-acetaminophen-caffeine (FIORICET) 50-325-40 MG tablet  Every 6 hours PRN     02/04/19 0534    varenicline (CHANTIX) 0.5 MG tablet  2 times daily     02/04/19 0534           Lovella Hardie, Corene Cornea, MD 02/05/19 [phone]

## 2019-05-19 ENCOUNTER — Ambulatory Visit: Payer: Self-pay | Admitting: Internal Medicine

## 2019-05-23 ENCOUNTER — Emergency Department
Admission: EM | Admit: 2019-05-23 | Discharge: 2019-05-23 | Disposition: A | Discharge status: Home / Self Care | Payer: No Typology Code available for payment source | Attending: Emergency Medicine | Admitting: Emergency Medicine

## 2019-05-23 ENCOUNTER — Other Ambulatory Visit: Payer: Self-pay

## 2019-05-23 ENCOUNTER — Emergency Department: Payer: No Typology Code available for payment source

## 2019-05-23 ENCOUNTER — Encounter: Payer: Self-pay | Admitting: Emergency Medicine

## 2019-05-23 DIAGNOSIS — M79606 Pain in leg, unspecified: Secondary | ICD-10-CM | POA: Insufficient documentation

## 2019-05-23 DIAGNOSIS — Y93I9 Activity, other involving external motion: Secondary | ICD-10-CM | POA: Insufficient documentation

## 2019-05-23 DIAGNOSIS — Y92410 Unspecified street and highway as the place of occurrence of the external cause: Secondary | ICD-10-CM | POA: Diagnosis not present

## 2019-05-23 DIAGNOSIS — S14105A Unspecified injury at C5 level of cervical spinal cord, initial encounter: Secondary | ICD-10-CM | POA: Insufficient documentation

## 2019-05-23 DIAGNOSIS — I10 Essential (primary) hypertension: Secondary | ICD-10-CM | POA: Insufficient documentation

## 2019-05-23 DIAGNOSIS — M545 Low back pain, unspecified: Secondary | ICD-10-CM

## 2019-05-23 DIAGNOSIS — Y999 Unspecified external cause status: Secondary | ICD-10-CM | POA: Diagnosis not present

## 2019-05-23 DIAGNOSIS — Z7982 Long term (current) use of aspirin: Secondary | ICD-10-CM | POA: Diagnosis not present

## 2019-05-23 DIAGNOSIS — Z794 Long term (current) use of insulin: Secondary | ICD-10-CM | POA: Insufficient documentation

## 2019-05-23 DIAGNOSIS — E119 Type 2 diabetes mellitus without complications: Secondary | ICD-10-CM | POA: Diagnosis not present

## 2019-05-23 DIAGNOSIS — R519 Headache, unspecified: Secondary | ICD-10-CM | POA: Diagnosis not present

## 2019-05-23 DIAGNOSIS — T07XXXA Unspecified multiple injuries, initial encounter: Secondary | ICD-10-CM

## 2019-05-23 DIAGNOSIS — F17210 Nicotine dependence, cigarettes, uncomplicated: Secondary | ICD-10-CM | POA: Diagnosis not present

## 2019-05-23 MED ORDER — IBUPROFEN 800 MG PO TABS
800.0000 mg | ORAL_TABLET | ORAL | Status: AC
  Administered 2019-05-23: 800 mg via ORAL
  Filled 2019-05-23: qty 1

## 2019-05-23 MED ORDER — CYCLOBENZAPRINE HCL 5 MG PO TABS: 5.0000 mg | ORAL_TABLET | Freq: Three times a day (TID) | ORAL | 0 refills | Status: DC | PRN

## 2019-05-23 MED ORDER — BACITRACIN-NEOMYCIN-POLYMYXIN 400-5-5000 EX OINT
TOPICAL_OINTMENT | Freq: Once | CUTANEOUS | Status: AC
  Administered 2019-05-23: 1 via TOPICAL
  Filled 2019-05-23: qty 1

## 2019-05-23 MED ORDER — NAPROXEN 500 MG PO TABS: 500.0000 mg | ORAL_TABLET | Freq: Two times a day (BID) | ORAL | 0 refills | Status: AC

## 2019-05-23 NOTE — ED Provider Notes (Signed)
Imaging reviewed, he is awake alert ambulatory.  Reports only mild discomfort back of his neck.  Discussed with the patient and strongly recommended he be fitted with a hard neck collar prior to being discharged, he had actually ordered one from [facility], patient notified me he cannot wait any longer.  He is amenable to following up with Dr. Cari Caraway on Monday, and I sent Dr. Cari Caraway 8 clinic message requesting if his clinic could potentially have the patient fitted for a call on Monday.  Patient did not wish to stay for further evaluation and having a neck brace fitted for him tonight.  I did discuss very careful return precautions with the patient including if you develop severe neck pain, numbness weakness tingling or difficulty walking or using his arms or legs that he should come back right away.  Patient agreeable  Return precautions and treatment recommendations and follow-up discussed with the patient who is agreeable with the plan.    Delman Kitten, MD 05/23/19 [phone]

## 2019-05-23 NOTE — ED Provider Notes (Signed)
Medical screening examination/treatment/procedure(s) were conducted as a shared visit with non-physician practitioner(s) and myself.  I personally evaluated the patient during the encounter.    Personally saw and evaluated.  Patient awake alert hemodynamically stable.  Small contusion with probable slight hematoma to the left temporal scalp region.  Does complain of neck pain.  Ordered imaging excluding CT head and cervical spine.  Otherwise on secondary survey noted slight skin tear over the left lower leg without evidence of long bone fracture   Delman Kitten, MD 05/23/19 1708

## 2019-05-23 NOTE — ED Notes (Signed)
Wounds at L shin and L ear assessed; cleansed; ointment applied; dressing placed at shin.

## 2019-05-23 NOTE — ED Notes (Signed)
[employer] called for C-collar
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
## 2019-05-23 NOTE — ED Notes (Signed)
Pt verbally agrees to wait for aspen c-collar before leaving. Pt denies numbness/tingling or balance issues. Sitting calmly in bed.
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
## 2019-05-31 ENCOUNTER — Emergency Department (HOSPITAL_COMMUNITY): Payer: No Typology Code available for payment source

## 2019-05-31 ENCOUNTER — Emergency Department (HOSPITAL_COMMUNITY)
Admission: EM | Admit: 2019-05-31 | Discharge: 2019-05-31 | Disposition: A | Discharge status: Home / Self Care | Payer: No Typology Code available for payment source | Attending: Emergency Medicine | Admitting: Emergency Medicine

## 2019-05-31 ENCOUNTER — Other Ambulatory Visit: Payer: Self-pay

## 2019-05-31 ENCOUNTER — Encounter (HOSPITAL_COMMUNITY): Payer: Self-pay

## 2019-05-31 DIAGNOSIS — W0110XA Fall on same level from slipping, tripping and stumbling with subsequent striking against unspecified object, initial encounter: Secondary | ICD-10-CM | POA: Diagnosis not present

## 2019-05-31 DIAGNOSIS — E119 Type 2 diabetes mellitus without complications: Secondary | ICD-10-CM | POA: Insufficient documentation

## 2019-05-31 DIAGNOSIS — Y999 Unspecified external cause status: Secondary | ICD-10-CM | POA: Insufficient documentation

## 2019-05-31 DIAGNOSIS — F17210 Nicotine dependence, cigarettes, uncomplicated: Secondary | ICD-10-CM | POA: Diagnosis not present

## 2019-05-31 DIAGNOSIS — Z7982 Long term (current) use of aspirin: Secondary | ICD-10-CM | POA: Diagnosis not present

## 2019-05-31 DIAGNOSIS — R55 Syncope and collapse: Secondary | ICD-10-CM | POA: Insufficient documentation

## 2019-05-31 DIAGNOSIS — I10 Essential (primary) hypertension: Secondary | ICD-10-CM | POA: Diagnosis not present

## 2019-05-31 DIAGNOSIS — Z794 Long term (current) use of insulin: Secondary | ICD-10-CM | POA: Diagnosis not present

## 2019-05-31 DIAGNOSIS — R42 Dizziness and giddiness: Secondary | ICD-10-CM | POA: Diagnosis not present

## 2019-05-31 DIAGNOSIS — Z79899 Other long term (current) drug therapy: Secondary | ICD-10-CM | POA: Insufficient documentation

## 2019-05-31 DIAGNOSIS — Y9301 Activity, walking, marching and hiking: Secondary | ICD-10-CM | POA: Insufficient documentation

## 2019-05-31 DIAGNOSIS — Y92009 Unspecified place in unspecified non-institutional (private) residence as the place of occurrence of the external cause: Secondary | ICD-10-CM | POA: Diagnosis not present

## 2019-05-31 LAB — BASIC METABOLIC PANEL
Anion gap: 11 (ref 5–15)
BUN: 15 mg/dL (ref 6–20)
CO2: 22 mmol/L (ref 22–32)
Calcium: 9.4 mg/dL (ref 8.9–10.3)
Chloride: 104 mmol/L (ref 98–111)
Creatinine, Ser: 1.05 mg/dL (ref 0.61–1.24)
GFR calc Af Amer: >60 mL/min (ref >60)
GFR calc non Af Amer: >60 mL/min (ref >60)
Glucose, Bld: 225 mg/dL — ABNORMAL HIGH (ref 70–99)
Potassium: 4.2 mmol/L (ref 3.5–5.1)
Sodium: 137 mmol/L (ref 135–145)

## 2019-05-31 LAB — CBC
HCT: 46.4 % (ref 39.0–52.0)
Hemoglobin: 16.4 g/dL (ref 13.0–17.0)
MCH: 33.5 pg (ref 26.0–34.0)
MCHC: 35.3 g/dL (ref 30.0–36.0)
MCV: 94.7 fL (ref 80.0–100.0)
Platelets: 166 10*3/uL (ref 150–400)
RBC: 4.9 MIL/uL (ref 4.22–5.81)
RDW: 11.8 % (ref 11.5–15.5)
WBC: 5 10*3/uL (ref 4.0–10.5)
nRBC: 0 % (ref 0.0–0.2)

## 2019-05-31 LAB — URINALYSIS, ROUTINE W REFLEX MICROSCOPIC
Bacteria, UA: NONE SEEN
Bilirubin Urine: NEGATIVE
Glucose, UA: >=500 mg/dL — AB
Hgb urine dipstick: NEGATIVE
Ketones, ur: NEGATIVE mg/dL
Leukocytes,Ua: NEGATIVE
Nitrite: NEGATIVE
Protein, ur: NEGATIVE mg/dL
Specific Gravity, Urine: >1.046 — ABNORMAL HIGH (ref 1.005–1.030)
pH: 5 (ref 5.0–8.0)

## 2019-05-31 MED ORDER — SODIUM CHLORIDE 0.9% FLUSH: 3.0000 mL | Freq: Once | INTRAVENOUS | Status: DC

## 2019-05-31 MED ORDER — SODIUM CHLORIDE 0.9 % IV BOLUS
1000.0000 mL | Freq: Once | INTRAVENOUS | Status: AC
  Administered 2019-05-31: 18:00:00 1000 mL via INTRAVENOUS

## 2019-05-31 MED ORDER — IOHEXOL 350 MG/ML SOLN
100.0000 mL | Freq: Once | INTRAVENOUS | Status: AC | PRN
  Administered 2019-05-31: 100 mL via INTRAVENOUS

## 2019-05-31 NOTE — ED Notes (Signed)
ED Provider at bedside. 
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
## 2019-11-01 ENCOUNTER — Ambulatory Visit: Payer: Self-pay

## 2020-05-02 ENCOUNTER — Other Ambulatory Visit: Payer: Self-pay

## 2020-05-02 ENCOUNTER — Emergency Department (HOSPITAL_COMMUNITY)
Admission: EM | Admit: 2020-05-02 | Discharge: 2020-05-02 | Disposition: A | Discharge status: Home / Self Care | Payer: Self-pay | Attending: Emergency Medicine | Admitting: Emergency Medicine

## 2020-05-02 ENCOUNTER — Encounter (HOSPITAL_COMMUNITY): Payer: Self-pay | Admitting: Emergency Medicine

## 2020-05-02 DIAGNOSIS — Z7984 Long term (current) use of oral hypoglycemic drugs: Secondary | ICD-10-CM | POA: Insufficient documentation

## 2020-05-02 DIAGNOSIS — Z20822 Contact with and (suspected) exposure to covid-19: Secondary | ICD-10-CM | POA: Insufficient documentation

## 2020-05-02 DIAGNOSIS — E119 Type 2 diabetes mellitus without complications: Secondary | ICD-10-CM | POA: Insufficient documentation

## 2020-05-02 DIAGNOSIS — I10 Essential (primary) hypertension: Secondary | ICD-10-CM | POA: Insufficient documentation

## 2020-05-02 DIAGNOSIS — J029 Acute pharyngitis, unspecified: Secondary | ICD-10-CM | POA: Insufficient documentation

## 2020-05-02 DIAGNOSIS — F17210 Nicotine dependence, cigarettes, uncomplicated: Secondary | ICD-10-CM | POA: Insufficient documentation

## 2020-05-02 DIAGNOSIS — Z7982 Long term (current) use of aspirin: Secondary | ICD-10-CM | POA: Insufficient documentation

## 2020-05-02 DIAGNOSIS — K047 Periapical abscess without sinus: Secondary | ICD-10-CM | POA: Insufficient documentation

## 2020-05-02 LAB — RESPIRATORY PANEL BY RT PCR (FLU A&B, COVID)
Influenza A by PCR: NEGATIVE
Influenza B by PCR: NEGATIVE
SARS Coronavirus 2 by RT PCR: NEGATIVE

## 2020-05-02 LAB — GROUP A STREP BY PCR: Group A Strep by PCR: NOT DETECTED

## 2020-05-02 MED ORDER — OXYCODONE-ACETAMINOPHEN 5-325 MG PO TABS
1.0000 | ORAL_TABLET | Freq: Once | ORAL | Status: AC
  Administered 2020-05-02: 1 via ORAL
  Filled 2020-05-02: qty 1

## 2020-05-02 MED ORDER — BUPIVACAINE HCL (PF) 0.5 % IJ SOLN
10.0000 mL | Freq: Once | INTRAMUSCULAR | Status: AC
  Administered 2020-05-02: 10 mL
  Filled 2020-05-02: qty 10

## 2020-05-02 MED ORDER — CLINDAMYCIN HCL 150 MG PO CAPS: 450.0000 mg | ORAL_CAPSULE | Freq: Three times a day (TID) | ORAL | 0 refills | Status: AC

## 2020-05-02 MED ORDER — LIDOCAINE VISCOUS HCL 2 % MT SOLN: 15.0000 mL | OROMUCOSAL | 0 refills | Status: DC | PRN

## 2020-05-02 MED ORDER — LIDOCAINE VISCOUS HCL 2 % MT SOLN
15.0000 mL | Freq: Once | OROMUCOSAL | Status: AC
  Administered 2020-05-02: 15 mL via OROMUCOSAL
  Filled 2020-05-02: qty 15

## 2020-05-02 MED ORDER — CLINDAMYCIN HCL 150 MG PO CAPS
450.0000 mg | ORAL_CAPSULE | Freq: Once | ORAL | Status: AC
  Administered 2020-05-02: 450 mg via ORAL
  Filled 2020-05-02: qty 3

## 2020-05-02 NOTE — Discharge Instructions (Signed)
You were seen in the ER for dental pain and sore throat  You had a dental abscess, this was drained in the ER  Take antibiotic as prescribed. This should help with infection and the pain.   Take 1000 mg of acetaminophen (Tylenol) every 6 hours.  For more pain control take ibuprofen 600 mg every 6 hours.  You can combine acetaminophen and ibuprofen as above every 6 hours for maximum pain and inflammation control.  Use pea sized amount of Orajel 15 to 20 minutes around area of pain and exposed nerves prior to eating or drinking.  Warm water and salt soaks can help with inflammation and drainage formation.  Use lidocaine suspension as needed for throat pain. Soft pureed diet   Unfortunately, dental pain will continue until you have a full dental evaluation and treatment.  See dental resources attached.  Additionally, I have given you Dr. Leanord Asal contact information, they frequently try to accommodate patients from the ER.  Return to the ER for fevers, facial or gum line swelling, redness, pus, worsening throat pain or neck swelling  Your strep test is NEGATIVE. Your COVID test is pending, you can check MyChart results in the next 2-3 days.

## 2020-05-02 NOTE — ED Triage Notes (Signed)
Pt reports sore throat and L lower gum swelling x 3 days.

## 2020-05-02 NOTE — ED Notes (Signed)
Patient verbalizes understanding of discharge instructions. Opportunity for questioning and answers were provided. Armband removed by staff, pt discharged from ED.  

## 2020-05-02 NOTE — ED Provider Notes (Signed)
[facility] EMERGENCY DEPARTMENT Provider Note   CSN: [id number] Arrival date & time: 05/02/20  1118     History Chief Complaint  Patient presents with  . Dental Pain    Gary Henderson is a 54 y.o. male with history of diabetes, hypertension presents to the ED for evaluation of sore throat and dental pain for the last 3 days.  Sore throat is left-sided, worse with swallowing.  He has swelling along the gumline in the lower posterior teeth.  Has had dental abscesses before that have required incision and drainage.  Has tried ibuprofen and acetaminophen over-the-counter for pain without improvement.  Denies sick contacts.  Denies nasal congestion, sore throat, cough, chest pain, shortness of breath, diarrhea, abdominal pain.  No changes in his voice, drooling.    HPI     Past Medical History:  Diagnosis Date  . Diabetes mellitus without complication (HCC)   . Hypertension   . Noncompliance     There are no problems to display for this patient.   History reviewed. No pertinent surgical history.     No family history on file.  Social History   Tobacco Use  . Smoking status: Current Every Day Smoker    Packs/day: 1.00    Years: 20.00    Pack years: 20.00    Types: Cigarettes  . Smokeless tobacco: Never Used  Vaping Use  . Vaping Use: Never used  Substance Use Topics  . Alcohol use: No  . Drug use: No    Home Medications Prior to Admission medications   Medication Sig Start Date End Date Taking? Authorizing Provider  aspirin 81 MG chewable tablet Chew 81 mg by mouth daily.    [provider]  atorvastatin (LIPITOR) 20 MG tablet Take 20 mg by mouth daily at 6 PM.     [provider]  clindamycin (CLEOCIN) 150 MG capsule Take 3 capsules (450 mg total) by mouth 3 (three) times daily for 10 days. 05/02/20 05/12/20  Liberty Handy, PA-C  cyclobenzaprine (FLEXERIL) 5 MG tablet Take 1 tablet (5 mg total) by mouth 3 (three) times  daily as needed. 05/23/19   Menshew, Charlesetta Ivory, PA-C  glipiZIDE (GLUCOTROL XL) 10 MG 24 hr tablet Take 1 tablet (10 mg total) by mouth daily with breakfast for 30 days. 08/18/18 01/30/28  Samuel Jester, DO  Insulin Glargine (LANTUS) 100 UNIT/ML Solostar Pen Inject 80 Units into the skin daily for 30 days. Patient taking differently: Inject 82 Units into the skin at bedtime.  08/18/18 01/30/28  Samuel Jester, DO  Insulin Pen Needle (AURORA PEN NEEDLES) 31G X 8 MM MISC 1 each by Does not apply route daily. Can dispense generic for lantus solostar pen 08/18/18   Samuel Jester, DO  ketorolac (TORADOL) 10 MG tablet Take 1 tablet (10 mg total) by mouth every 12 (twelve) hours as needed (severe headache). 02/04/19   Mesner, Barbara Cower, MD  lidocaine (XYLOCAINE) 2 % solution Use as directed 15 mLs in the mouth or throat as needed for mouth pain (sore throat). 05/02/20   Liberty Handy, PA-C  losartan (COZAAR) 50 MG tablet Take 50 mg by mouth daily.    [provider]  metFORMIN (GLUCOPHAGE) 500 MG tablet Take 1 tablet (500 mg total) by mouth 2 (two) times daily with a meal for 30 days. 08/18/18 01/30/28  Samuel Jester, DO  varenicline (CHANTIX) 0.5 MG tablet Take 1 tablet (0.5 mg total) by mouth 2 (two) times daily. 02/04/19  Mesner, Barbara Cower, MD    Allergies    Gabapentin and Lisinopril  Review of Systems   Review of Systems  HENT: Positive for dental problem and sore throat.   All other systems reviewed and are negative.   Physical Exam Updated Vital Signs BP (!) 176/113 (BP Location: Left Arm)   Pulse (!) 108   Temp 99.6 F (37.6 C) (Oral)   Resp 16   SpO2 100%   Physical Exam Vitals and nursing note reviewed.  Constitutional:      General: He is not in acute distress.    Appearance: He is well-developed.     Comments: NAD.  HENT:     Head: Normocephalic and atraumatic.     Right Ear: External ear normal.     Left Ear: External ear normal.     Nose: Nose normal.      Mouth/Throat:     Pharynx: Posterior oropharyngeal erythema present.     Comments: Diffuse erythema over soft palate, tonsillar pillars, tonsils and uvula and oropharynx.  No significant tonsillar hypertrophy.  No exudates.  No trismus.  Uvula is midline.  Normal phonation.  Normal sublingual space.  Poor dentition throughout, several missing teeth.  Lingual gingiva around teeth #37 and 38 is edematous, erythematous, tender, slightly fluctuant.  Unable to express any purulent drainage with palpation.  Pain with percussion over these teeth.  Normal sublingual space. Eyes:     General: No scleral icterus.    Conjunctiva/sclera: Conjunctivae normal.  Neck:     Comments: Mild left-sided submandibular lymphadenopathy/tenderness.  Trachea is midline.  No significant asymmetric facial or anterior/lateral neck edema, crepitus Cardiovascular:     Rate and Rhythm: Normal rate and regular rhythm.     Heart sounds: Normal heart sounds. No murmur heard.   Pulmonary:     Effort: Pulmonary effort is normal.     Breath sounds: Normal breath sounds. No wheezing.  Musculoskeletal:        General: No deformity. Normal range of motion.     Cervical back: Normal range of motion and neck supple.  Lymphadenopathy:     Cervical: Cervical adenopathy present.  Skin:    General: Skin is warm and dry.     Capillary Refill: Capillary refill takes less than 2 seconds.  Neurological:     Mental Status: He is alert and oriented to person, place, and time.  Psychiatric:        Behavior: Behavior normal.        Thought Content: Thought content normal.        Judgment: Judgment normal.     ED Results / Procedures / Treatments   Labs (all labs ordered are listed, but only abnormal results are displayed) Labs Reviewed  GROUP A STREP BY PCR  RESPIRATORY PANEL BY PCR    EKG None  Radiology No results found.  Procedures Procedures (including critical care time)  Medications Ordered in ED Medications    bupivacaine (MARCAINE) 0.5 % injection 10 mL (has no administration in time range)  clindamycin (CLEOCIN) capsule 450 mg (has no administration in time range)  oxyCODONE-acetaminophen (PERCOCET/ROXICET) 5-325 MG per tablet 1 tablet (has no administration in time range)  lidocaine (XYLOCAINE) 2 % viscous mouth solution 15 mL (has no administration in time range)    ED Course  I have reviewed the triage vital signs and the nursing notes.  Pertinent labs & imaging results that were available during my care of the patient were reviewed by me and  considered in my medical decision making (see chart for details).    MDM Rules/Calculators/A&P                          54 year old male with history of diabetes, hypertension here for left lower dental pain and left-sided sore throat for 3 days.  On exam he has obvious area of dental abscess on the lingual aspect of the gingiva of lower molars.  This was incised and drained with significant purulent drainage and improvement in swelling.  Dental block provided complete anesthesia and patient had resolution of dental pain after procedure.  No fever.  No trismus.  Normal sublingual space, phonation.  On exam it does not appear that the dental abscess is expanding lower or deeper into the sublingual space, soft palate, throat, neck or face.  His throat exam is actually quite benign and just has erythema diffusely.  No asymmetry of tonsillar pillars, tonsils, exudates.  Suspect dental abscess and viral pharyngitis/tonsillitis.    Patient is fully vaccinated for COVID August 2021.  Strep test ordered and this was negative.  We will test for Covid as patient would rather be sure it is not Covid.  He has no other Covid symptoms.  Tolerating secretions and fluid challenge here in the ED.  We will discharge with clindamycin for dental infection, high-dose NSAIDs, soft diet, warm liquids.  He was given dentist information and encouraged to follow-up.  Return  precautions discussed.  Patient is comfortable this plan. Final Clinical Impression(s) / ED Diagnoses Final diagnoses:  Dental abscess  Sore throat    Rx / DC Orders ED Discharge Orders         Ordered    clindamycin (CLEOCIN) 150 MG capsule  3 times daily        05/02/20 1444    lidocaine (XYLOCAINE) 2 % solution  As needed        05/02/20 1444           Jerrell Mylar 05/02/20 1444    Tilden Fossa, MD 05/02/20 1521

## 2021-06-06 ENCOUNTER — Emergency Department
Admission: EM | Admit: 2021-06-06 | Discharge: 2021-06-06 | Disposition: A | Discharge status: Home / Self Care | Payer: Self-pay | Attending: Emergency Medicine | Admitting: Emergency Medicine

## 2021-06-06 ENCOUNTER — Other Ambulatory Visit: Payer: Self-pay

## 2021-06-06 DIAGNOSIS — Z5321 Procedure and treatment not carried out due to patient leaving prior to being seen by health care provider: Secondary | ICD-10-CM | POA: Insufficient documentation

## 2021-06-06 DIAGNOSIS — L02213 Cutaneous abscess of chest wall: Secondary | ICD-10-CM | POA: Insufficient documentation

## 2021-06-06 DIAGNOSIS — L02412 Cutaneous abscess of left axilla: Secondary | ICD-10-CM | POA: Insufficient documentation

## 2021-06-06 DIAGNOSIS — L02411 Cutaneous abscess of right axilla: Secondary | ICD-10-CM | POA: Insufficient documentation

## 2021-06-06 LAB — CBC
HCT: 45.4 % (ref 39.0–52.0)
Hemoglobin: 15.6 g/dL (ref 13.0–17.0)
MCH: 32.8 pg (ref 26.0–34.0)
MCHC: 34.4 g/dL (ref 30.0–36.0)
MCV: 95.6 fL (ref 80.0–100.0)
Platelets: 180 10*3/uL (ref 150–400)
RBC: 4.75 MIL/uL (ref 4.22–5.81)
RDW: 12.4 % (ref 11.5–15.5)
WBC: 7 10*3/uL (ref 4.0–10.5)
nRBC: 0 % (ref 0.0–0.2)

## 2021-06-06 LAB — BASIC METABOLIC PANEL
Anion gap: 5 (ref 5–15)
BUN: 15 mg/dL (ref 6–20)
CO2: 26 mmol/L (ref 22–32)
Calcium: 9.2 mg/dL (ref 8.9–10.3)
Chloride: 107 mmol/L (ref 98–111)
Creatinine, Ser: 1.09 mg/dL (ref 0.61–1.24)
GFR, Estimated: >60 mL/min (ref >60)
Glucose, Bld: 183 mg/dL — ABNORMAL HIGH (ref 70–99)
Potassium: 4.3 mmol/L (ref 3.5–5.1)
Sodium: 138 mmol/L (ref 135–145)

## 2021-06-06 NOTE — ED Triage Notes (Signed)
Pt c/o multiple abscess/boils under his under arms and on his chest. Per pt, he has had these before and took antibiotics and they went away.
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
## 2023-04-19 ENCOUNTER — Emergency Department
Admission: EM | Admit: 2023-04-19 | Discharge: 2023-04-19 | Disposition: A | Discharge status: Home / Self Care | Payer: 59 | Attending: Emergency Medicine | Admitting: Emergency Medicine

## 2023-04-19 ENCOUNTER — Other Ambulatory Visit: Payer: Self-pay

## 2023-04-19 ENCOUNTER — Emergency Department: Payer: 59

## 2023-04-19 ENCOUNTER — Encounter: Payer: Self-pay | Admitting: Emergency Medicine

## 2023-04-19 DIAGNOSIS — S4992XA Unspecified injury of left shoulder and upper arm, initial encounter: Secondary | ICD-10-CM | POA: Insufficient documentation

## 2023-04-19 DIAGNOSIS — M7542 Impingement syndrome of left shoulder: Secondary | ICD-10-CM | POA: Insufficient documentation

## 2023-04-19 DIAGNOSIS — W010XXA Fall on same level from slipping, tripping and stumbling without subsequent striking against object, initial encounter: Secondary | ICD-10-CM | POA: Insufficient documentation

## 2023-04-19 DIAGNOSIS — M25512 Pain in left shoulder: Secondary | ICD-10-CM | POA: Diagnosis not present

## 2023-04-19 DIAGNOSIS — M19012 Primary osteoarthritis, left shoulder: Secondary | ICD-10-CM | POA: Diagnosis not present

## 2023-04-19 NOTE — Discharge Instructions (Signed)
Use Tylenol for pain and fevers.  Up to 1000 mg per dose, up to 4 times per day.  Do not take more than 4000 mg of Tylenol/acetaminophen within 24 hours..  Use naproxen/Aleve for anti-inflammatory pain relief. Use up to 500mg every 12 hours. Do not take more frequently than this. Do not use other NSAIDs (ibuprofen, Advil) while taking this medication. It is safe to take Tylenol with this.   

## 2023-04-19 NOTE — ED Provider Notes (Signed)
[facility] Provider Note    Event Date/Time   First MD Initiated Contact with Patient 04/19/23 [phone]     (approximate)   History   Shoulder Injury   HPI  Gary Henderson is a 57 y.o. male who presents to the ED for evaluation of Shoulder Injury   Patient presents with subacute left shoulder pain after an injury.  Tripped over his puppy by accident and he is uncertain how the shoulder landed but is had pain since then.  Reports pain with lifting/abducting over his head to the superior and posterior aspects of his left shoulder.   Physical Exam   Triage Vital Signs: ED Triage Vitals  Encounter Vitals Group     BP 04/19/23 0029 (!) 157/93     Systolic BP Percentile --      Diastolic BP Percentile --      Pulse Rate 04/19/23 0029 93     Resp 04/19/23 0029 19     Temp 04/19/23 0029 98.5 F (36.9 C)     Temp src --      SpO2 04/19/23 0029 100 %     Weight 04/19/23 0028 210 lb (95.3 kg)     Height 04/19/23 0028 6' (1.829 m)     Head Circumference --      Peak Flow --      Pain Score 04/19/23 0028 7     Pain Loc --      Pain Education --      Exclude from Growth Chart --     Most recent vital signs: Vitals:   04/19/23 0029  BP: (!) 157/93  Pulse: 93  Resp: 19  Temp: 98.5 F (36.9 C)  SpO2: 100%    General: Awake, no distress.  CV:  Good peripheral perfusion.  Resp:  Normal effort.  Abd:  No distention.  MSK:  No deformity noted.  No skin changes, evidence of open injury or deformity over the shoulder.  Distal LUE is neurovascularly intact Neuro:  No focal deficits appreciated. Other:  Pain with empty can test and abduction over 90 degrees   ED Results / Procedures / Treatments   Labs (all labs ordered are listed, but only abnormal results are displayed) Labs Reviewed - No data to display  EKG   RADIOLOGY Plain film of the left shoulder interpreted by me without evidence of fracture or dislocation  Official radiology  report(s): DG Shoulder Left  Result Date: 04/19/2023 CLINICAL DATA:  Tripped over puppy and fell onto left shoulder 2 weeks ago. Persistent pain. EXAM: LEFT SHOULDER - 2+ VIEW COMPARISON:  None Available. FINDINGS: There is no evidence of fracture or dislocation. Degenerative arthritis AC joint. Soft tissues are unremarkable. IMPRESSION: No acute fracture or dislocation. Electronically Signed   By: Minerva Fester M.D.   On: 04/19/2023 01:12    PROCEDURES and INTERVENTIONS:  Procedures  Medications - No data to display   IMPRESSION / MDM / ASSESSMENT AND PLAN / ED COURSE  I reviewed the triage vital signs and the nursing notes.  Differential diagnosis includes, but is not limited to, fracture, dislocation, tendinous injury or rotator cuff injury  Patient presents with subacute left shoulder pain with evidence of an impingement syndrome suitable for outpatient management       FINAL CLINICAL IMPRESSION(S) / ED DIAGNOSES   Final diagnoses:  Injury of left shoulder, initial encounter  Impingement syndrome of left shoulder     Rx / DC Orders  ED Discharge Orders     None        Note:  This document was prepared using Dragon voice recognition software and may include unintentional dictation errors.   Delton Prairie, MD 04/19/23 [phone]

## 2023-04-19 NOTE — ED Triage Notes (Signed)
Patient ambulatory to triage with steady gait, without difficulty or distress noted; pt reports 2wks ago that he tripped over his puppy and fell onto his left shoulder; cont to have persistent pain

## 2023-07-22 DIAGNOSIS — Z5948 Other specified lack of adequate food: Secondary | ICD-10-CM | POA: Diagnosis not present

## 2023-07-22 DIAGNOSIS — Z720 Tobacco use: Secondary | ICD-10-CM | POA: Diagnosis not present

## 2023-07-22 DIAGNOSIS — Z888 Allergy status to other drugs, medicaments and biological substances status: Secondary | ICD-10-CM | POA: Diagnosis not present

## 2023-07-22 DIAGNOSIS — E785 Hyperlipidemia, unspecified: Secondary | ICD-10-CM | POA: Diagnosis not present

## 2023-07-22 DIAGNOSIS — E1142 Type 2 diabetes mellitus with diabetic polyneuropathy: Secondary | ICD-10-CM | POA: Diagnosis not present

## 2023-07-22 DIAGNOSIS — Z008 Encounter for other general examination: Secondary | ICD-10-CM | POA: Diagnosis not present

## 2023-07-22 DIAGNOSIS — Z885 Allergy status to narcotic agent status: Secondary | ICD-10-CM | POA: Diagnosis not present

## 2023-07-22 DIAGNOSIS — Z5941 Food insecurity: Secondary | ICD-10-CM | POA: Diagnosis not present

## 2023-07-22 DIAGNOSIS — Z794 Long term (current) use of insulin: Secondary | ICD-10-CM | POA: Diagnosis not present

## 2023-07-22 DIAGNOSIS — I10 Essential (primary) hypertension: Secondary | ICD-10-CM | POA: Diagnosis not present

## 2023-07-22 DIAGNOSIS — Z8249 Family history of ischemic heart disease and other diseases of the circulatory system: Secondary | ICD-10-CM | POA: Diagnosis not present

## 2023-07-22 DIAGNOSIS — E1165 Type 2 diabetes mellitus with hyperglycemia: Secondary | ICD-10-CM | POA: Diagnosis not present

## 2023-07-22 DIAGNOSIS — M1990 Unspecified osteoarthritis, unspecified site: Secondary | ICD-10-CM | POA: Diagnosis not present

## 2024-03-16 ENCOUNTER — Other Ambulatory Visit: Payer: Self-pay

## 2024-03-16 ENCOUNTER — Emergency Department
Admission: EM | Admit: 2024-03-16 | Discharge: 2024-03-16 | Disposition: A | Discharge status: Home / Self Care | Payer: Self-pay | Attending: Emergency Medicine | Admitting: Emergency Medicine

## 2024-03-16 DIAGNOSIS — I10 Essential (primary) hypertension: Secondary | ICD-10-CM | POA: Insufficient documentation

## 2024-03-16 DIAGNOSIS — E119 Type 2 diabetes mellitus without complications: Secondary | ICD-10-CM | POA: Insufficient documentation

## 2024-03-16 DIAGNOSIS — L0291 Cutaneous abscess, unspecified: Secondary | ICD-10-CM

## 2024-03-16 DIAGNOSIS — L02212 Cutaneous abscess of back [any part, except buttock]: Secondary | ICD-10-CM | POA: Insufficient documentation

## 2024-03-16 MED ORDER — HYDROMORPHONE HCL 1 MG/ML IJ SOLN
1.0000 mg | Freq: Once | INTRAMUSCULAR | Status: AC
  Administered 2024-03-16: 1 mg via INTRAMUSCULAR
  Filled 2024-03-16: qty 1

## 2024-03-16 MED ORDER — SULFAMETHOXAZOLE-TRIMETHOPRIM 800-160 MG PO TABS: 1.0000 | ORAL_TABLET | Freq: Two times a day (BID) | ORAL | 0 refills | Status: AC

## 2024-03-16 MED ORDER — LIDOCAINE HCL (PF) 1 % IJ SOLN
15.0000 mL | Freq: Once | INTRAMUSCULAR | Status: AC
  Administered 2024-03-16: 15 mL
  Filled 2024-03-16: qty 15

## 2024-03-16 MED ORDER — CEPHALEXIN 500 MG PO CAPS: 500.0000 mg | ORAL_CAPSULE | Freq: Four times a day (QID) | ORAL | 0 refills | Status: AC

## 2024-03-16 NOTE — ED Triage Notes (Signed)
 Pt comes with c/o three weeks of abscess. Pt states he noticed it and it has progressively gotten worse. Pt has bandage in place. Pt has large swollen area to back warmth noted. Drainage is green and appears infected. Pt states he had chills at home and was sweaty.

## 2024-03-16 NOTE — Discharge Instructions (Signed)
 Take both of the antibiotics as prescribed and finish the full 7-day course.  Change the dressings on the incision site regularly and keep it clean.  You may continue to have some oozing of blood or pus.  Return to the ER for new, worsening, or persistent pain, swelling, redness, bleeding, increased pain, fever, or any other new or worsening symptoms that concern you.

## 2024-03-16 NOTE — ED Notes (Signed)
Declined dc vitals.

## 2024-03-16 NOTE — ED Provider Notes (Signed)
 [facility] Provider Note    Event Date/Time   First MD Initiated Contact with Patient 03/16/24 [phone]     (approximate)   History   Abscess   HPI  Gary Henderson is a 58 y.o. male with a history of diabetes and hypertension who presents with a large area of swelling and pain on his left upper back which has been present for about 3 weeks and is worsening.  There is a small area draining pus, but the swelling has increased in size.  He has not taken anything for it.  He denies any other abscesses or areas of skin swelling.  He has no fever or chills.  I reviewed the past medical records for the patient's most recent prior encounter in our system was on 10/10 of last year in the ED for shoulder injury.  Previously he was seen at the [facility] in ED in 2021 with dental pain.   Physical Exam   Triage Vital Signs: ED Triage Vitals  Encounter Vitals Group     BP 03/16/24 0928 (!) 136/104     Girls Systolic BP Percentile --      Girls Diastolic BP Percentile --      Boys Systolic BP Percentile --      Boys Diastolic BP Percentile --      Pulse Rate 03/16/24 0928 95     Resp 03/16/24 0928 18     Temp 03/16/24 0928 98.3 F (36.8 C)     Temp src --      SpO2 03/16/24 0928 100 %     Weight 03/16/24 0927 215 lb (97.5 kg)     Height 03/16/24 0927 6' (1.829 m)     Head Circumference --      Peak Flow --      Pain Score 03/16/24 0927 10     Pain Loc --      Pain Education --      Exclude from Growth Chart --     Most recent vital signs: Vitals:   03/16/24 0928 03/16/24 1000  BP: (!) 136/104 133/75  Pulse: 95 90  Resp: 18 18  Temp: 98.3 F (36.8 C)   SpO2: 100% 99%     General: Awake, no distress.  CV:  Good peripheral perfusion.  Resp:  Normal effort.  Abd:  No distention.  Other:  Approximately 15 x 20 cm area of swelling, fluctuance, and induration over the left upper back with a central 5 mm pustule.   ED Results / Procedures /  Treatments   Labs (all labs ordered are listed, but only abnormal results are displayed) Labs Reviewed - No data to display   EKG   RADIOLOGY   PROCEDURES:  Critical Care performed: No  .Incision and Drainage  Date/Time: 03/16/2024 11:39 AM  Performed by: Jacolyn Pae, MD Authorized by: Jacolyn Pae, MD   Consent:    Consent obtained:  Verbal   Consent given by:  Patient   Risks discussed:  Bleeding, infection, incomplete drainage and pain   Alternatives discussed:  Alternative treatment, delayed treatment and observation Universal protocol:    Patient identity confirmed:  Verbally with patient Location:    Type:  Abscess   Location:  Trunk   Trunk location:  Back Sedation:    Sedation type:  None Anesthesia:    Anesthesia method:  Local infiltration   Local anesthetic:  Lidocaine  1% w/o epi Procedure type:    Complexity:  Complex  Procedure details:    Incision types:  Single straight   Wound management:  Probed and deloculated   Drainage:  Bloody and purulent   Drainage amount:  Copious   Wound treatment:  Wound left open   Packing materials:  None Post-procedure details:    Procedure completion:  Tolerated well, no immediate complications    MEDICATIONS ORDERED IN ED: Medications  HYDROmorphone  (DILAUDID ) injection 1 mg (1 mg Intramuscular Given 03/16/24 1011)  lidocaine  (PF) (XYLOCAINE ) 1 % injection 15 mL (15 mLs Other Given 03/16/24 1047)  HYDROmorphone  (DILAUDID ) injection 1 mg (1 mg Intramuscular Given 03/16/24 1056)     IMPRESSION / MDM / ASSESSMENT AND PLAN / ED COURSE  I reviewed the triage vital signs and the nursing notes.  58 year old male with PMH as noted above presents with a large fluctuant and indurated mass to his left upper back with a central pustule.  He has no systemic symptoms.  Vital signs are normal.  Differential diagnosis includes, but is not limited to, abscess.  There is minimal surrounding cellulitis, but the area  of abscess is very large and has been present for several weeks.  It is relatively indurated.  We will perform I&D.  There is no indication for lab workup.  Patient's presentation is most consistent with acute, uncomplicated illness.  ----------------------------------------- 11:40 AM on 03/16/2024 -----------------------------------------  I&D was performed successfully with return of a large amount of pus, and the patient tolerated the procedure well.  His pain is improved.  Given the large area of the lesion and the surrounding indurated skin, he will benefit from a course of antibiotics with MRSA coverage.  I have prescribed Keflex  and Bactrim .  I counseled the patient on strict return precautions, and he expressed understanding.   FINAL CLINICAL IMPRESSION(S) / ED DIAGNOSES   Final diagnoses:  Abscess     Rx / DC Orders   ED Discharge Orders          Ordered    cephALEXin  (KEFLEX ) 500 MG capsule  4 times daily        03/16/24 1125    sulfamethoxazole -trimethoprim  (BACTRIM  DS) 800-160 MG tablet  2 times daily        03/16/24 1125             Note:  This document was prepared using Dragon voice recognition software and may include unintentional dictation errors.    Jacolyn Pae, MD 03/16/24 1141

## 2024-04-01 ENCOUNTER — Emergency Department: Payer: Self-pay

## 2024-04-01 ENCOUNTER — Inpatient Hospital Stay
Admission: EM | Admit: 2024-04-01 | Discharge: 2024-04-03 | DRG: 638 | Disposition: A | Discharge status: Home / Self Care | Payer: MEDICAID | Attending: Obstetrics and Gynecology | Admitting: Obstetrics and Gynecology

## 2024-04-01 ENCOUNTER — Other Ambulatory Visit: Payer: Self-pay

## 2024-04-01 DIAGNOSIS — J36 Peritonsillar abscess: Secondary | ICD-10-CM | POA: Diagnosis present

## 2024-04-01 DIAGNOSIS — E11649 Type 2 diabetes mellitus with hypoglycemia without coma: Secondary | ICD-10-CM | POA: Diagnosis present

## 2024-04-01 DIAGNOSIS — E785 Hyperlipidemia, unspecified: Secondary | ICD-10-CM | POA: Diagnosis present

## 2024-04-01 DIAGNOSIS — F17210 Nicotine dependence, cigarettes, uncomplicated: Secondary | ICD-10-CM | POA: Diagnosis present

## 2024-04-01 DIAGNOSIS — L02212 Cutaneous abscess of back [any part, except buttock]: Secondary | ICD-10-CM | POA: Diagnosis present

## 2024-04-01 DIAGNOSIS — Z7982 Long term (current) use of aspirin: Secondary | ICD-10-CM

## 2024-04-01 DIAGNOSIS — Z79899 Other long term (current) drug therapy: Secondary | ICD-10-CM

## 2024-04-01 DIAGNOSIS — I10 Essential (primary) hypertension: Secondary | ICD-10-CM | POA: Diagnosis present

## 2024-04-01 DIAGNOSIS — E11628 Type 2 diabetes mellitus with other skin complications: Secondary | ICD-10-CM | POA: Diagnosis present

## 2024-04-01 DIAGNOSIS — L089 Local infection of the skin and subcutaneous tissue, unspecified: Secondary | ICD-10-CM

## 2024-04-01 DIAGNOSIS — E1165 Type 2 diabetes mellitus with hyperglycemia: Secondary | ICD-10-CM | POA: Diagnosis not present

## 2024-04-01 DIAGNOSIS — Z886 Allergy status to analgesic agent status: Secondary | ICD-10-CM | POA: Diagnosis not present

## 2024-04-01 DIAGNOSIS — Z794 Long term (current) use of insulin: Secondary | ICD-10-CM

## 2024-04-01 DIAGNOSIS — L0291 Cutaneous abscess, unspecified: Principal | ICD-10-CM

## 2024-04-01 DIAGNOSIS — Z888 Allergy status to other drugs, medicaments and biological substances status: Secondary | ICD-10-CM

## 2024-04-01 DIAGNOSIS — Z91199 Patient's noncompliance with other medical treatment and regimen due to unspecified reason: Secondary | ICD-10-CM

## 2024-04-01 DIAGNOSIS — R7989 Other specified abnormal findings of blood chemistry: Secondary | ICD-10-CM

## 2024-04-01 DIAGNOSIS — E860 Dehydration: Secondary | ICD-10-CM | POA: Diagnosis present

## 2024-04-01 DIAGNOSIS — Z7984 Long term (current) use of oral hypoglycemic drugs: Secondary | ICD-10-CM

## 2024-04-01 DIAGNOSIS — Z885 Allergy status to narcotic agent status: Secondary | ICD-10-CM | POA: Diagnosis not present

## 2024-04-01 DIAGNOSIS — E13649 Other specified diabetes mellitus with hypoglycemia without coma: Secondary | ICD-10-CM

## 2024-04-01 DIAGNOSIS — E119 Type 2 diabetes mellitus without complications: Secondary | ICD-10-CM

## 2024-04-01 DIAGNOSIS — R739 Hyperglycemia, unspecified: Secondary | ICD-10-CM

## 2024-04-01 DIAGNOSIS — E872 Acidosis, unspecified: Secondary | ICD-10-CM | POA: Diagnosis present

## 2024-04-01 LAB — LACTIC ACID, PLASMA
Lactic Acid, Venous: 0.9 mmol/L (ref 0.5–1.9)
Lactic Acid, Venous: 1.3 mmol/L (ref 0.5–1.9)

## 2024-04-01 LAB — COMPREHENSIVE METABOLIC PANEL WITH GFR
ALT: 21 U/L (ref 0–44)
AST: 14 U/L — ABNORMAL LOW (ref 15–41)
Albumin: 3.8 g/dL (ref 3.5–5.0)
Alkaline Phosphatase: 99 U/L (ref 38–126)
Anion gap: 13 (ref 5–15)
BUN: 24 mg/dL — ABNORMAL HIGH (ref 6–20)
CO2: 18 mmol/L — ABNORMAL LOW (ref 22–32)
Calcium: 8.9 mg/dL (ref 8.9–10.3)
Chloride: 98 mmol/L (ref 98–111)
Creatinine, Ser: 1.25 mg/dL — ABNORMAL HIGH (ref 0.61–1.24)
GFR, Estimated: >60 mL/min (ref >60)
Glucose, Bld: 472 mg/dL — ABNORMAL HIGH (ref 70–99)
Potassium: 5 mmol/L (ref 3.5–5.1)
Sodium: 129 mmol/L — ABNORMAL LOW (ref 135–145)
Total Bilirubin: 1.2 mg/dL (ref 0.0–1.2)
Total Protein: 8.1 g/dL (ref 6.5–8.1)

## 2024-04-01 LAB — URINALYSIS, COMPLETE (UACMP) WITH MICROSCOPIC
Bacteria, UA: NONE SEEN
Bilirubin Urine: NEGATIVE
Glucose, UA: >=500 mg/dL — AB
Hgb urine dipstick: NEGATIVE
Ketones, ur: 5 mg/dL — AB
Leukocytes,Ua: NEGATIVE
Nitrite: NEGATIVE
Protein, ur: NEGATIVE mg/dL
RBC / HPF: 0 RBC/hpf (ref 0–5)
Specific Gravity, Urine: 1.036 — ABNORMAL HIGH (ref 1.005–1.030)
Squamous Epithelial / HPF: 0 /HPF (ref 0–5)
pH: 5 (ref 5.0–8.0)

## 2024-04-01 LAB — CBC
HCT: 47.5 % (ref 39.0–52.0)
Hemoglobin: 16 g/dL (ref 13.0–17.0)
MCH: 31.9 pg (ref 26.0–34.0)
MCHC: 33.7 g/dL (ref 30.0–36.0)
MCV: 94.8 fL (ref 80.0–100.0)
Platelets: 212 K/uL (ref 150–400)
RBC: 5.01 MIL/uL (ref 4.22–5.81)
RDW: 12.7 % (ref 11.5–15.5)
WBC: 9.7 K/uL (ref 4.0–10.5)
nRBC: 0 % (ref 0.0–0.2)

## 2024-04-01 LAB — HEMOGLOBIN A1C
Hgb A1c MFr Bld: 14.8 % — ABNORMAL HIGH (ref 4.8–5.6)
Mean Plasma Glucose: 378.06 mg/dL

## 2024-04-01 LAB — GLUCOSE, CAPILLARY
Glucose-Capillary: 415 mg/dL — ABNORMAL HIGH (ref 70–99)
Glucose-Capillary: 517 mg/dL (ref 70–99)
Glucose-Capillary: 64 mg/dL — ABNORMAL LOW (ref 70–99)
Glucose-Capillary: 229 mg/dL — ABNORMAL HIGH (ref 70–99)

## 2024-04-01 MED ORDER — INSULIN GLARGINE 100 UNIT/ML ~~LOC~~ SOLN
30.0000 [IU] | Freq: Two times a day (BID) | SUBCUTANEOUS | Status: DC
Start: 2024-04-01 — End: 2024-04-01
  Filled 2024-04-01: qty 0.3

## 2024-04-01 MED ORDER — LOSARTAN POTASSIUM 50 MG PO TABS
50.0000 mg | ORAL_TABLET | Freq: Every day | ORAL | Status: DC
  Administered 2024-04-01 – 2024-04-03 (×3): 50 mg via ORAL
  Filled 2024-04-01 (×3): qty 1

## 2024-04-01 MED ORDER — VANCOMYCIN HCL 2000 MG/400ML IV SOLN
2000.0000 mg | Freq: Once | INTRAVENOUS | Status: AC
  Administered 2024-04-01: 2000 mg via INTRAVENOUS
  Filled 2024-04-01: qty 400

## 2024-04-01 MED ORDER — LACTATED RINGERS IV SOLN: INTRAVENOUS | Status: DC

## 2024-04-01 MED ORDER — ENOXAPARIN SODIUM 40 MG/0.4ML IJ SOSY
40.0000 mg | PREFILLED_SYRINGE | INTRAMUSCULAR | Status: DC
  Administered 2024-04-01 – 2024-04-02 (×2): 40 mg via SUBCUTANEOUS
  Filled 2024-04-01 (×2): qty 0.4

## 2024-04-01 MED ORDER — INSULIN ASPART 100 UNIT/ML IJ SOLN
0.0000 [IU] | Freq: Three times a day (TID) | INTRAMUSCULAR | Status: DC
  Administered 2024-04-01: 20 [IU] via SUBCUTANEOUS
  Administered 2024-04-02: 11 [IU] via SUBCUTANEOUS
  Administered 2024-04-02: 3 [IU] via SUBCUTANEOUS
  Administered 2024-04-03 (×2): 4 [IU] via SUBCUTANEOUS
  Filled 2024-04-01 (×6): qty 1

## 2024-04-01 MED ORDER — LACTATED RINGERS IV BOLUS
1000.0000 mL | Freq: Once | INTRAVENOUS | Status: AC
  Administered 2024-04-01: 1000 mL via INTRAVENOUS

## 2024-04-01 MED ORDER — ACETAMINOPHEN 325 MG PO TABS
650.0000 mg | ORAL_TABLET | Freq: Four times a day (QID) | ORAL | Status: DC | PRN
  Filled 2024-04-01: qty 2

## 2024-04-01 MED ORDER — ACETAMINOPHEN 325 MG RE SUPP: 650.0000 mg | Freq: Four times a day (QID) | RECTAL | Status: DC | PRN

## 2024-04-01 MED ORDER — POLYETHYLENE GLYCOL 3350 17 G PO PACK: 17.0000 g | PACK | Freq: Every day | ORAL | Status: DC | PRN

## 2024-04-01 MED ORDER — IOHEXOL 300 MG/ML  SOLN
100.0000 mL | Freq: Once | INTRAMUSCULAR | Status: AC | PRN
  Administered 2024-04-01: 100 mL via INTRAVENOUS

## 2024-04-01 MED ORDER — INSULIN GLARGINE-YFGN 100 UNIT/ML ~~LOC~~ SOLN
30.0000 [IU] | Freq: Two times a day (BID) | SUBCUTANEOUS | Status: DC
Start: 2024-04-01 — End: 2024-04-01

## 2024-04-01 MED ORDER — SODIUM CHLORIDE 0.9% FLUSH
3.0000 mL | Freq: Two times a day (BID) | INTRAVENOUS | Status: DC
  Administered 2024-04-01 – 2024-04-02 (×3): 3 mL via INTRAVENOUS

## 2024-04-01 MED ORDER — ENSURE PLUS HIGH PROTEIN PO LIQD
237.0000 mL | Freq: Two times a day (BID) | ORAL | Status: DC
  Administered 2024-04-02: 237 mL via ORAL

## 2024-04-01 MED ORDER — ONDANSETRON HCL 4 MG PO TABS: 4.0000 mg | ORAL_TABLET | Freq: Four times a day (QID) | ORAL | Status: DC | PRN

## 2024-04-01 MED ORDER — INSULIN GLARGINE 100 UNIT/ML ~~LOC~~ SOLN
30.0000 [IU] | Freq: Two times a day (BID) | SUBCUTANEOUS | Status: DC
  Administered 2024-04-02: 30 [IU] via SUBCUTANEOUS
  Filled 2024-04-01 (×4): qty 0.3

## 2024-04-01 MED ORDER — VANCOMYCIN HCL IN DEXTROSE 1-5 GM/200ML-% IV SOLN
1000.0000 mg | Freq: Two times a day (BID) | INTRAVENOUS | Status: DC
  Administered 2024-04-02: 1000 mg via INTRAVENOUS
  Filled 2024-04-01 (×2): qty 200

## 2024-04-01 MED ORDER — INSULIN ASPART 100 UNIT/ML IJ SOLN
30.0000 [IU] | Freq: Once | INTRAMUSCULAR | Status: AC
Start: 2024-04-01 — End: 2024-04-01
  Administered 2024-04-01: 30 [IU] via SUBCUTANEOUS
  Filled 2024-04-01: qty 1

## 2024-04-01 MED ORDER — PIPERACILLIN-TAZOBACTAM 3.375 G IVPB 30 MIN
3.3750 g | Freq: Once | INTRAVENOUS | Status: AC
  Administered 2024-04-01: 3.375 g via INTRAVENOUS
  Filled 2024-04-01: qty 50

## 2024-04-01 MED ORDER — ONDANSETRON HCL 4 MG/2ML IJ SOLN: 4.0000 mg | Freq: Four times a day (QID) | INTRAMUSCULAR | Status: DC | PRN

## 2024-04-01 MED ORDER — ATORVASTATIN CALCIUM 20 MG PO TABS
20.0000 mg | ORAL_TABLET | Freq: Every day | ORAL | Status: DC
  Administered 2024-04-01 – 2024-04-02 (×2): 20 mg via ORAL
  Filled 2024-04-01 (×2): qty 1

## 2024-04-01 MED ORDER — INSULIN ASPART 100 UNIT/ML IJ SOLN
0.0000 [IU] | Freq: Every day | INTRAMUSCULAR | Status: DC
  Administered 2024-04-02: 4 [IU] via SUBCUTANEOUS
  Filled 2024-04-01: qty 1

## 2024-04-01 MED ORDER — LACTATED RINGERS IV SOLN: INTRAVENOUS | Status: AC

## 2024-04-01 MED ORDER — INSULIN ASPART 100 UNIT/ML IJ SOLN
6.0000 [IU] | Freq: Three times a day (TID) | INTRAMUSCULAR | Status: DC
  Administered 2024-04-02 – 2024-04-03 (×2): 6 [IU] via SUBCUTANEOUS
  Filled 2024-04-01 (×4): qty 1

## 2024-04-01 NOTE — H&P (Addendum)
 History and Physical    Patient: Gary Henderson FMW:[id number] DOB: 1965/10/26 DOA: 04/01/2024 DOS: the patient was seen and examined on 04/01/2024 PCP: [facility]  Patient coming from: Home  Chief Complaint:  Chief Complaint  Patient presents with   Abscess   HPI: Gary Henderson is a 58 y.o. male with medical history significant of type 2 diabetes mellitus, hypertension and dyslipidemia came to ED with recurrent back abscess, not feeling well, subjective fever and chills.  Patient apparently came to ED 1-1/2 weeks ago with a large abscess on his upper back which was drained in the ED and he was started on antibiotics and sent home.  No cultures were obtained.  That area stopped draining and becoming painful and enlarging again.  Patient was not feeling well, subjective fever and chills, poor p.o. intake and appetite and feeling weak so came back to ED.  Patient denies any other recent illnesses, no upper respiratory or urinary symptoms.  ED course and data reviewed.  Patient was mildly tachycardic on presentation, otherwise stable vitals, labs with pseudohyponatremia with sodium at 129 and blood glucose of 472, BUN 24, creatinine 1.25, CO2 18, no anion gap, lactic acid 0.9.  A large tennis ball size tender indurated abscess on left side of upper back. CT chest with contrast was normal.  EDP discussed the case with general surgery and they recommend admission with TRH, they will take him to the OR for incision and drainage under anesthesia tomorrow morning.  Patient was given IV fluid bolus and started on Zosyn   Review of Systems: As mentioned in the history of present illness. All other systems reviewed and are negative. Past Medical History:  Diagnosis Date   Diabetes mellitus without complication (HCC)    Hypertension    Noncompliance    History reviewed. No pertinent surgical history. Social History:  reports that he has been smoking cigarettes. He has a 20  pack-year smoking history. He has never used smokeless tobacco. He reports that he does not drink alcohol and does not use drugs.  Allergies  Allergen Reactions   Gabapentin Shortness Of Breath    sweating   Lisinopril Swelling and Rash   Percocet [Oxycodone -Acetaminophen ]     History reviewed. No pertinent family history.  Prior to Admission medications   Medication Sig Start Date End Date Taking? Authorizing Provider  aspirin 81 MG chewable tablet Chew 81 mg by mouth daily.    [provider]  atorvastatin  (LIPITOR) 20 MG tablet Take 20 mg by mouth daily at 6 PM.     [provider]  cyclobenzaprine  (FLEXERIL ) 5 MG tablet Take 1 tablet (5 mg total) by mouth 3 (three) times daily as needed. 05/23/19   Menshew, Candida LULLA Kings, PA-C  glipiZIDE  (GLUCOTROL  XL) 10 MG 24 hr tablet Take 1 tablet (10 mg total) by mouth daily with breakfast for 30 days. 08/18/18 01/30/28  Joyice Sauer, DO  Insulin  Glargine (LANTUS ) 100 UNIT/ML Solostar Pen Inject 80 Units into the skin daily for 30 days. Patient taking differently: Inject 82 Units into the skin at bedtime.  08/18/18 01/30/28  Joyice Sauer, DO  Insulin  Pen Needle (AURORA PEN NEEDLES) 31G X 8 MM MISC 1 each by Does not apply route daily. Can dispense generic for lantus  solostar pen 08/18/18   Joyice Sauer, DO  ketorolac  (TORADOL ) 10 MG tablet Take 1 tablet (10 mg total) by mouth every 12 (twelve) hours as needed (severe headache). 02/04/19   Mesner, Selinda, MD  lidocaine  (XYLOCAINE ) 2 % solution Use as directed 15 mLs in the mouth or throat as needed for mouth pain (sore throat). 05/02/20   Norris Will PARAS, PA-C  losartan  (COZAAR ) 50 MG tablet Take 50 mg by mouth daily.    [provider]  metFORMIN  (GLUCOPHAGE ) 500 MG tablet Take 1 tablet (500 mg total) by mouth 2 (two) times daily with a meal for 30 days. 08/18/18 01/30/28  Joyice Sauer, DO  varenicline  (CHANTIX ) 0.5 MG tablet Take 1 tablet (0.5 mg total) by  mouth 2 (two) times daily. 02/04/19   Mesner, Selinda, MD    Physical Exam: Vitals:   04/01/24 1356 04/01/24 1357 04/01/24 1708  BP: (!) 159/107  (!) 151/86  Pulse: (!) 113  (!) 103  Resp: 17  16  Temp: 98.6 F (37 C)  98.4 F (36.9 C)  TempSrc: Oral    SpO2: 98%  99%  Weight:  86.2 kg   Height:  6' (1.829 m)    Vitals:   04/01/24 1356 04/01/24 1357 04/01/24 1708  BP: (!) 159/107  (!) 151/86  Pulse: (!) 113  (!) 103  Resp: 17  16  Temp: 98.6 F (37 C)  98.4 F (36.9 C)  TempSrc: Oral    SpO2: 98%  99%  Weight:  86.2 kg   Height:  6' (1.829 m)    General: Vital signs reviewed.  Patient is well-developed and well-nourished, in no acute distress and cooperative with exam.  Head: Normocephalic and atraumatic. Eyes: EOMI, conjunctivae normal, no scleral icterus.  Neck: Supple, trachea midline, normal ROM, no JVD,  Cardiovascular: RRR, S1 normal, S2 normal, no murmurs, gallops, or rubs. Pulmonary/Chest: Clear to auscultation bilaterally, no wheezes, rales, or rhonchi. Abdominal: Soft, non-tender, non-distended, BS +, Extremities: No lower extremity edema bilaterally,  pulses symmetric and intact bilaterally. No cyanosis or clubbing. Neurological: A&O x3, Strength is normal and symmetric bilaterally, cranial nerve II-XII are grossly intact, no focal motor deficit, sensory intact to light touch bilaterally.  Skin: Large tennis ball size indurated area on left side of upper back. Psychiatric: Normal mood and affect.   Data Reviewed: Prior data reviewed as mentioned above  Assessment and Plan: * Abscess of back, except buttock Significantly large abscess, failed outpatient therapy and recent incision and drainage in ED about a week ago.  General surgery was consulted by EDP and they are planning to take him to the OR tomorrow morning. - Admit to MedSurg - Started on Zosyn  -Blood cultures ordered -Supportive care  Diabetes mellitus without complication (HCC) Significantly  elevated blood glucose, mild none anion gap metabolic acidosis. Clinically appears dry.  Patient is on glipizide , metformin  and 80 units of Lantus  at home, did not took his medication or insulin  today. None anion gap metabolic acidosis. Currently managing with IV fluid and insulin  boluses, if remain elevated then might need to be started on Endo tool -Check A1c -Semglee  30 units twice daily -Resistant SSI -6 units with meal  Elevated serum creatinine Not meeting the criteria for AKI, mildly elevated creatinine likely due to dehydration with poor p.o. intake. - Giving some IV fluid -Monitor renal function  Metabolic acidosis Patient with known anion gap metabolic acidosis with hyperglycemia and current infection. - Lactic acid was ordered-pending -Giving some IV fluid -Continue to monitor  Hypertension Blood pressure currently elevated. -Continuing home losartan  -As needed hydralazine   Pseudohyponatremia Patient with pseudohyponatremia secondary to hyperglycemia. - Continue to monitor and correct hyperglycemia  Dyslipidemia - Continue home Lipitor  Advance Care Planning:   Code Status: Full Code   Consults: General Surgery  Family Communication: Discussed with girlfriend at bedside  Severity of Illness: The appropriate patient status for this patient is INPATIENT. Inpatient status is judged to be reasonable and necessary in order to provide the required intensity of service to ensure the patient's safety. The patient's presenting symptoms, physical exam findings, and initial radiographic and laboratory data in the context of their chronic comorbidities is felt to place them at high risk for further clinical deterioration. Furthermore, it is not anticipated that the patient will be medically stable for discharge from the hospital within 2 midnights of admission.   * I certify that at the point of admission it is my clinical judgment that the patient will require inpatient  hospital care spanning beyond 2 midnights from the point of admission due to high intensity of service, high risk for further deterioration and high frequency of surveillance required.*  This record has been created using Conservation officer, historic buildings. Errors have been sought and corrected,but may not always be located. Such creation errors do not reflect on the standard of care.   Author: Amaryllis Dare, MD 04/01/2024 5:38 PM  For on call review [email or web address].

## 2024-04-01 NOTE — Assessment & Plan Note (Signed)
 Blood pressure currently elevated. -Continuing home losartan  -As needed hydralazine 

## 2024-04-01 NOTE — Progress Notes (Signed)
 CODE SEPSIS - PHARMACY COMMUNICATION  **Broad Spectrum Antibiotics should be administered within 1 hour of Sepsis diagnosis**  Time Code Sepsis Called/Page Received: 16:18  Antibiotics Ordered:  Antibiotics not given within 1 hour of sepsis diagnosis due to patient being transferred to the floor. Also, per nurse, only has one line and has orders for 2 boluses and 2 antibiotics. ED nurses did not start the bolus, so waiting to place another line.   Time of 1st antibiotic administration: Not given within 1 hour of sepsis diagnosis   Additional action taken by pharmacy:   If necessary, Name of Provider/Nurse Contacted: Merlynn Sakai, LPN and Rolene Jaffe, RN    Ransom Blanch PGY-1 Pharmacy Resident  [place] - [facility]  04/01/2024 6:23 PM

## 2024-04-01 NOTE — Assessment & Plan Note (Addendum)
 Significantly elevated blood glucose, mild none anion gap metabolic acidosis. Clinically appears dry.  Patient is on glipizide , metformin  and 80 units of Lantus  at home, did not took his medication or insulin  today. None anion gap metabolic acidosis. Currently managing with IV fluid and insulin  boluses, if remain elevated then might need to be started on Endo tool -Check A1c -Semglee  30 units twice daily -Resistant SSI -6 units with meal

## 2024-04-01 NOTE — Assessment & Plan Note (Signed)
 Continue home Lipitor

## 2024-04-01 NOTE — Assessment & Plan Note (Signed)
 Not meeting the criteria for AKI, mildly elevated creatinine likely due to dehydration with poor p.o. intake. - Giving some IV fluid -Monitor renal function

## 2024-04-01 NOTE — Assessment & Plan Note (Signed)
 Patient with known anion gap metabolic acidosis with hyperglycemia and current infection. - Lactic acid was ordered-pending -Giving some IV fluid -Continue to monitor

## 2024-04-01 NOTE — ED Provider Notes (Signed)
 [facility] Provider Note    Event Date/Time   First MD Initiated Contact with Patient 04/01/24 1552     (approximate)   History   Abscess   HPI  Gary Henderson is a 58 y.o. male with diabetes and hypertension who comes in with concerns for abscess.  I reviewed a note where patient was seen on 03/16/2024 where his left upper back he had a large abscess.  This underwent I&D.  With large amount of pus.  He was treated with Keflex , Bactrim   He reports having decreased energy, and weakness.  He reports that he completed the antibiotics that patient received here and that he is also been on clindamycin  for the past 3 to 4 days.  Continues to have a large amount of swelling in his upper back.  He does report also a little bit of pain in his tooth and feeling like he might have an abscess starting there as well.  He wonders if he could have strep throat.  He reports that his sugars have also been uncontrolled and that he does take insulin  typically but they have been really high as well.     Physical Exam   Triage Vital Signs: ED Triage Vitals  Encounter Vitals Group     BP 04/01/24 1356 (!) 159/107     Girls Systolic BP Percentile --      Girls Diastolic BP Percentile --      Boys Systolic BP Percentile --      Boys Diastolic BP Percentile --      Pulse Rate 04/01/24 1356 (!) 113     Resp 04/01/24 1356 17     Temp 04/01/24 1356 98.6 F (37 C)     Temp Source 04/01/24 1356 Oral     SpO2 04/01/24 1356 98 %     Weight 04/01/24 1357 190 lb (86.2 kg)     Height 04/01/24 1357 6' (1.829 m)     Head Circumference --      Peak Flow --      Pain Score 04/01/24 1357 10     Pain Loc --      Pain Education --      Exclude from Growth Chart --     Most recent vital signs: Vitals:   04/01/24 1356  BP: (!) 159/107  Pulse: (!) 113  Resp: 17  Temp: 98.6 F (37 C)  SpO2: 98%     General: Awake, no distress.  CV:  Good peripheral perfusion.  Resp:  Normal  effort.  Abd:  No distention.  Soft nontender Other:  Uvula midline.  No obvious exudates on the tonsils.  Poor dentition without any palpable tooth abscess. Softball sized mass on his upper back.  ED Results / Procedures / Treatments   Labs (all labs ordered are listed, but only abnormal results are displayed) Labs Reviewed  COMPREHENSIVE METABOLIC PANEL WITH GFR - Abnormal; Notable for the following components:      Result Value   Sodium 129 (*)    CO2 18 (*)    Glucose, Bld 472 (*)    BUN 24 (*)    Creatinine, Ser 1.25 (*)    AST 14 (*)    All other components within normal limits  CBC       RADIOLOGY Pending   PROCEDURES:  Critical Care performed: No  Procedures   MEDICATIONS ORDERED IN ED: Medications  lactated ringers  bolus 1,000 mL (has no administration in time  range)  lactated ringers  bolus 1,000 mL (has no administration in time range)  piperacillin -tazobactam (ZOSYN ) IVPB 3.375 g (has no administration in time range)  insulin  glargine-yfgn (SEMGLEE ) injection 30 Units (has no administration in time range)  insulin  aspart (novoLOG ) injection 0-20 Units (has no administration in time range)  insulin  aspart (novoLOG ) injection 0-5 Units (has no administration in time range)  insulin  aspart (novoLOG ) injection 6 Units (has no administration in time range)  enoxaparin  (LOVENOX ) injection 40 mg (has no administration in time range)  sodium chloride  flush (NS) 0.9 % injection 3 mL (has no administration in time range)  lactated ringers  infusion (has no administration in time range)  acetaminophen  (TYLENOL ) tablet 650 mg (has no administration in time range)    Or  acetaminophen  (TYLENOL ) suppository 650 mg (has no administration in time range)  polyethylene glycol (MIRALAX  / GLYCOLAX ) packet 17 g (has no administration in time range)  ondansetron  (ZOFRAN ) tablet 4 mg (has no administration in time range)    Or  ondansetron  (ZOFRAN ) injection 4 mg (has no  administration in time range)  atorvastatin  (LIPITOR) tablet 20 mg (has no administration in time range)  losartan  (COZAAR ) tablet 50 mg (has no administration in time range)  vancomycin  (VANCOREADY) IVPB 2000 mg/400 mL (has no administration in time range)  vancomycin  (VANCOCIN ) IVPB 1000 mg/200 mL premix (has no administration in time range)  iohexol  (OMNIPAQUE ) 300 MG/ML solution 100 mL (100 mLs Intravenous Contrast Given 04/01/24 1637)     IMPRESSION / MDM / ASSESSMENT AND PLAN / ED COURSE  I reviewed the triage vital signs and the nursing notes.   Patient's presentation is most consistent with acute presentation with potential threat to life or bodily function.   Patient comes in with large abscess to his back.  This is concerning for failed I&D with still significant induration over this area.  We discussed doing another bedside I&D versus getting surgical team involved.  Patient was willing to have surgery team evaluate him.  I discussed the case with Dr. Celestia did recommend a CT chest to better characterize the abscess, keep n.p.o. at midnight that he would see patient tomorrow for evaluation on I&D.  I have discussed with the hospital team for admission given the significant abscess with uncontrolled diabetes and hyperglycemia.  He does have evidence of dehydration with AKI, as well as uncontrolled sugars in the 400s with low bicarb.  His anion gap is normal therefore I suspect this is more likely related to dehydration and patient will be fluid resuscitated.  Broad-spectrum antibiotics were started with Vanco and Zosyn .  I did evaluate patient's mouth I do not see any obvious signs of strep throat, peritonsillar abscess or tooth abscess however the antibiotics will also help cover this.  CBC is reassuring.  CMP shows evidence of low bicarb, AKI  I did discuss with hospital team for admission    FINAL CLINICAL IMPRESSION(S) / ED DIAGNOSES   Final diagnoses:  Abscess   Uncontrolled diabetes mellitus of other type with hypoglycemia, unspecified hypoglycemia coma status (HCC)  Hyperglycemia     Rx / DC Orders   ED Discharge Orders     None        Note:  This document was prepared using Dragon voice recognition software and may include unintentional dictation errors.   Ernest Ronal BRAVO, MD 04/01/24 [phone]

## 2024-04-01 NOTE — ED Triage Notes (Signed)
 Pt sts that he had a abscess I/D two and a half weeks ago. Pt sts that he isnt feeling himself and just feeling lousy and no energy.

## 2024-04-01 NOTE — Assessment & Plan Note (Signed)
 Significantly large abscess, failed outpatient therapy and recent incision and drainage in ED about a week ago.  General surgery was consulted by EDP and they are planning to take him to the OR tomorrow morning. - Admit to MedSurg - Started on Zosyn  -Blood cultures ordered -Supportive care

## 2024-04-01 NOTE — Assessment & Plan Note (Signed)
 Patient with pseudohyponatremia secondary to hyperglycemia. - Continue to monitor and correct hyperglycemia

## 2024-04-01 NOTE — Progress Notes (Signed)
 Pharmacy Antibiotic Note  Rucker USIEL ASTARITA is a 58 y.o. male admitted on 04/01/2024 with a left upper back wound infection. PMH significant for diabetes and hypertension. Patient was previously seen on 9/7 in the ED for CC of 73 week old abscess. I&D was performed and was discharged with Cephalexin  and Bactrim  for 7 days. Now, presenting to the ED again with weakness and fatigue. Pharmacy has been consulted for Vancomycin  dosing.  9/23: Patient currently has an AKI. Scr is 1.25 (BL 1-1.15). Will continue to monitor   Plan: - Will give a Vancomycin  2 g IV loading dose x 1  - Will start Vancomycin  maintenance dose at 1000 mg q12h (eAUC 510.8, Cmin 15.2, Scr 1.25, IBW used, Vd 0.72 L/kg) - Goal AUC 400-550 - Will continue to monitor renal function and signs of clinical improvement   Height: 6' (182.9 cm) Weight: 86.2 kg (190 lb) IBW/kg (Calculated) : 77.6  Temp (24hrs), Avg:98.6 F (37 C), Min:98.6 F (37 C), Max:98.6 F (37 C)  Recent Labs  Lab 04/01/24 1358  WBC 9.7  CREATININE 1.25*    Estimated Creatinine Clearance: 70.7 mL/min (A) (by C-G formula based on SCr of 1.25 mg/dL (H)).    Allergies  Allergen Reactions   Gabapentin Shortness Of Breath    sweating   Lisinopril Swelling and Rash   Percocet [Oxycodone -Acetaminophen ]     Antimicrobials this admission: Zosyn  x 1 Vancomycin  9/23 >>    Dose adjustments this admission:   Microbiology results: 9/23 BCx: pending    Thank you for allowing pharmacy to be a part of this patient's care.   Ransom Blanch PGY-1 Pharmacy Resident  [place] - [facility]  04/01/2024 4:47 PM

## 2024-04-02 ENCOUNTER — Encounter
Admission: EM | Disposition: A | Discharge status: Home / Self Care | Payer: Self-pay | Source: Home / Self Care | Attending: Obstetrics and Gynecology

## 2024-04-02 LAB — BASIC METABOLIC PANEL WITH GFR
Anion gap: 9 (ref 5–15)
BUN: 15 mg/dL (ref 6–20)
CO2: 26 mmol/L (ref 22–32)
Calcium: 8.5 mg/dL — ABNORMAL LOW (ref 8.9–10.3)
Chloride: 102 mmol/L (ref 98–111)
Creatinine, Ser: 0.92 mg/dL (ref 0.61–1.24)
GFR, Estimated: >60 mL/min (ref >60)
Glucose, Bld: 97 mg/dL (ref 70–99)
Potassium: 3.9 mmol/L (ref 3.5–5.1)
Sodium: 134 mmol/L — ABNORMAL LOW (ref 135–145)

## 2024-04-02 LAB — GLUCOSE, CAPILLARY
Glucose-Capillary: 113 mg/dL — ABNORMAL HIGH (ref 70–99)
Glucose-Capillary: 126 mg/dL — ABNORMAL HIGH (ref 70–99)
Glucose-Capillary: 242 mg/dL — ABNORMAL HIGH (ref 70–99)
Glucose-Capillary: 292 mg/dL — ABNORMAL HIGH (ref 70–99)
Glucose-Capillary: 357 mg/dL — ABNORMAL HIGH (ref 70–99)

## 2024-04-02 LAB — HIV ANTIBODY (ROUTINE TESTING W REFLEX): HIV Screen 4th Generation wRfx: NONREACTIVE

## 2024-04-02 LAB — GROUP A STREP BY PCR: Group A Strep by PCR: NOT DETECTED

## 2024-04-02 SURGERY — INCISION AND DRAINAGE, ABSCESS
Anesthesia: General

## 2024-04-02 MED ORDER — PHENOL 1.4 % MT LIQD
1.0000 | OROMUCOSAL | Status: DC | PRN
  Filled 2024-04-02: qty 177

## 2024-04-02 MED ORDER — VANCOMYCIN HCL 1250 MG/250ML IV SOLN
1250.0000 mg | Freq: Two times a day (BID) | INTRAVENOUS | Status: DC
Start: 2024-04-02 — End: 2024-04-03
  Administered 2024-04-02 – 2024-04-03 (×2): 1250 mg via INTRAVENOUS
  Filled 2024-04-02 (×3): qty 250

## 2024-04-02 MED ORDER — VANCOMYCIN VARIABLE DOSE PER UNSTABLE RENAL FUNCTION (PHARMACIST DOSING)
Status: DC
Start: 2024-04-02 — End: 2024-04-02

## 2024-04-02 MED ORDER — MAGIC MOUTHWASH W/LIDOCAINE
15.0000 mL | Freq: Four times a day (QID) | ORAL | Status: DC | PRN
  Administered 2024-04-02 (×2): 15 mL via ORAL
  Administered 2024-04-03: 5 mL via ORAL
  Filled 2024-04-02 (×5): qty 15

## 2024-04-02 NOTE — Progress Notes (Signed)
 Pt blood sugar is 242 but is refusing insulin . MD aware

## 2024-04-02 NOTE — Progress Notes (Signed)
 The patient had a high CBG at 1835, 417. The day shift nurse notified Amaryllis Dare, MD and made her aware that the patient's CBG was 417. Amaryllis Dare, MD gave order in secure chat to give an additional 20units of insulin . This Clinical research associate gave the 20 units of insulin  and rechecked the CBG at 2130 CBG was 64. This Clinical research associate gave the patient a sandwich tray and shasta twist per his request. This Clinical research associate rechecked the patient's CBG again at 2330, CBG was 229. No sliding scale coverage given to the patient due to the patient being NPO after MN for surgery in the morning.

## 2024-04-02 NOTE — Consult Note (Signed)
  [facility] SURGICAL CONSULTATION NOTE (initial) - cpt: 99244   HISTORY OF PRESENT ILLNESS (HPI):  58 y.o. male presented to [facility] ED yesterday for concerns over possible abscess. Patient reports he was seen in the ED on 09/07 for similar upper back abscess. He did have an I&D at that time. No Cx data available. He was sent home on Keflex  and Bactrim . At some point he was also reportedly on Clindamycin  for 2-4 days prior to this presentation. Additionally, he has been having sore throat and some trouble swallowing at home as well. He does have a history of DM and his blood glucose has been very poorly controlled at home. They were 500 on arrival. Work up in the ED revealed a normal WBC to 9.7K, Hgb to 16.0, renal function baseline with sCr 1.25, normal venous lactate of 0.9. CT Chest was obtained without gross evidence of abscess. He was admitted to medicine. Currently on Vancomycin    Surgery is consulted by emergency medicine physician Dr. Oneil Lewandowsky, MD in this context for evaluation and management of possible back abscess.  PAST MEDICAL HISTORY (PMH):  Past Medical History:  Diagnosis Date   Diabetes mellitus without complication (HCC)    Hypertension    Noncompliance      PAST SURGICAL HISTORY (PSH):  History reviewed. No pertinent surgical history.   MEDICATIONS:  Prior to Admission medications   Medication Sig Start Date End Date Taking? Authorizing Provider  aspirin 81 MG chewable tablet Chew 81 mg by mouth daily.    [provider]  atorvastatin  (LIPITOR) 20 MG tablet Take 20 mg by mouth daily at 6 PM.     [provider]  cyclobenzaprine  (FLEXERIL ) 5 MG tablet Take 1 tablet (5 mg total) by mouth 3 (three) times daily as needed. 05/23/19   Menshew, Candida LULLA Kings, PA-C  glipiZIDE  (GLUCOTROL  XL) 10 MG 24 hr tablet Take 1 tablet (10 mg total) by mouth daily with breakfast for 30 days. 08/18/18 01/30/28  Joyice Sauer, DO  Insulin  Glargine (LANTUS )  100 UNIT/ML Solostar Pen Inject 80 Units into the skin daily for 30 days. Patient taking differently: Inject 82 Units into the skin at bedtime.  08/18/18 01/30/28  Joyice Sauer, DO  Insulin  Pen Needle (AURORA PEN NEEDLES) 31G X 8 MM MISC 1 each by Does not apply route daily. Can dispense generic for lantus  solostar pen 08/18/18   Joyice Sauer, DO  ketorolac  (TORADOL ) 10 MG tablet Take 1 tablet (10 mg total) by mouth every 12 (twelve) hours as needed (severe headache). 02/04/19   Mesner, Selinda, MD  lidocaine  (XYLOCAINE ) 2 % solution Use as directed 15 mLs in the mouth or throat as needed for mouth pain (sore throat). 05/02/20   Norris Will PARAS, PA-C  losartan  (COZAAR ) 50 MG tablet Take 50 mg by mouth daily.    [provider]  metFORMIN  (GLUCOPHAGE ) 500 MG tablet Take 1 tablet (500 mg total) by mouth 2 (two) times daily with a meal for 30 days. 08/18/18 01/30/28  Joyice Sauer, DO  varenicline  (CHANTIX ) 0.5 MG tablet Take 1 tablet (0.5 mg total) by mouth 2 (two) times daily. 02/04/19   Mesner, Selinda, MD     ALLERGIES:  Allergies  Allergen Reactions   Gabapentin Shortness Of Breath    sweating   Lisinopril Swelling and Rash   Percocet [Oxycodone -Acetaminophen ]      SOCIAL HISTORY:  Social History   Socioeconomic History   Marital status: Single    Spouse name: Not  on file   Number of children: Not on file   Years of education: Not on file   Highest education level: Not on file  Occupational History   Not on file  Tobacco Use   Smoking status: Every Day    Current packs/day: 1.00    Average packs/day: 1 pack/day for 20.0 years (20.0 ttl pk-yrs)    Types: Cigarettes   Smokeless tobacco: Never  Vaping Use   Vaping status: Never Used  Substance and Sexual Activity   Alcohol use: No   Drug use: No   Sexual activity: Yes  Other Topics Concern   Not on file  Social History Narrative   Not on file   Social Drivers of Health   Financial Resource Strain: Not on  file  Food Insecurity: No Food Insecurity (04/01/2024)   Hunger Vital Sign    Worried About Running Out of Food in the Last Year: Never true    Ran Out of Food in the Last Year: Never true  Transportation Needs: No Transportation Needs (04/01/2024)   PRAPARE - Administrator, Civil Service (Medical): No    Lack of Transportation (Non-Medical): No  Physical Activity: Not on file  Stress: Not on file  Social Connections: Patient Declined (04/01/2024)   Social Connection and Isolation Panel    Frequency of Communication with Friends and Family: Patient declined    Frequency of Social Gatherings with Friends and Family: Patient declined    Attends Religious Services: Patient declined    Database administrator or Organizations: Patient declined    Attends Banker Meetings: Patient declined    Marital Status: Patient declined  Intimate Partner Violence: Not At Risk (04/01/2024)   Humiliation, Afraid, Rape, and Kick questionnaire    Fear of Current or Ex-Partner: No    Emotionally Abused: No    Physically Abused: No    Sexually Abused: No     FAMILY HISTORY:  History reviewed. No pertinent family history.    REVIEW OF SYSTEMS:  Review of Systems  Constitutional:  Negative for chills and fever.  HENT:  Positive for sore throat. Negative for congestion.   Respiratory:  Negative for cough and shortness of breath.   Cardiovascular:  Negative for chest pain and palpitations.  Gastrointestinal:  Negative for nausea and vomiting.  Genitourinary:  Negative for dysuria and urgency.  Skin:  Negative for itching and rash.  All other systems reviewed and are negative.   VITAL SIGNS:  Temp:  [98.4 F (36.9 C)-98.9 F (37.2 C)] 98.9 F (37.2 C) (09/24 0808) Pulse Rate:  [103-113] 105 (09/24 0808) Resp:  [16-19] 18 (09/24 0808) BP: (110-159)/(68-107) 148/89 (09/24 0808) SpO2:  [96 %-100 %] 100 % (09/24 0808) Weight:  [86.2 kg] 86.2 kg (09/23 1357)     Height: 6'  (182.9 cm) Weight: 86.2 kg BMI (Calculated): 25.76   INTAKE/OUTPUT:  09/23 0701 - 09/24 0700 In: 1649 [I.V.:827.7; IV Piggyback:821.3] Out: -   PHYSICAL EXAM:  Physical Exam Vitals and nursing note reviewed. Exam conducted with a chaperone present.  Constitutional:      General: He is not in acute distress.    Appearance: Normal appearance. He is normal weight.  HENT:     Head: Normocephalic and atraumatic.  Eyes:     General: No scleral icterus.    Conjunctiva/sclera: Conjunctivae normal.  Cardiovascular:     Rate and Rhythm: Normal rate.     Pulses: Normal pulses.  Pulmonary:  Effort: Pulmonary effort is normal. No respiratory distress.  Genitourinary:    Comments: Deferred Skin:    Comments: To the right mid-back, there is large circumferential area of induration with a central small, healing wound, likely secondary to his I&D on 09/07. I do not appreciate any gross fluctuance in this area on exam. I did make an effort to try and express fluid circumferentially but there is no appreciable drainage   Neurological:     General: No focal deficit present.     Mental Status: He is alert and oriented to person, place, and time.  Psychiatric:        Mood and Affect: Mood normal.        Behavior: Behavior normal.      Labs:     Latest Ref Rng & Units 04/01/2024    1:58 PM 06/06/2021    1:50 AM 05/31/2019    4:35 PM  CBC  WBC 4.0 - 10.5 K/uL 9.7  7.0  5.0   Hemoglobin 13.0 - 17.0 g/dL 83.9  84.3  83.5   Hematocrit 39.0 - 52.0 % 47.5  45.4  46.4   Platelets 150 - 400 K/uL 212  180  166       Latest Ref Rng & Units 04/01/2024    1:58 PM 06/06/2021    1:50 AM 05/31/2019    4:35 PM  CMP  Glucose 70 - 99 mg/dL 527  816  774   BUN 6 - 20 mg/dL 24  15  15    Creatinine 0.61 - 1.24 mg/dL 8.74  8.90  8.94   Sodium 135 - 145 mmol/L 129  138  137   Potassium 3.5 - 5.1 mmol/L 5.0  4.3  4.2   Chloride 98 - 111 mmol/L 98  107  104   CO2 22 - 32 mmol/L 18  26  22    Calcium   8.9 - 10.3 mg/dL 8.9  9.2  9.4   Total Protein 6.5 - 8.1 g/dL 8.1     Total Bilirubin 0.0 - 1.2 mg/dL 1.2     Alkaline Phos 38 - 126 U/L 99     AST 15 - 41 U/L 14     ALT 0 - 44 U/L 21      Imaging studies:   CT Chest (04/01/2024) personally reviewed without gross evidence of fluid collection in area of concern for abscess, there is potentially underlying inflammatory changes, no subcutaneous air to suggest necrosis, and radiologist report reviewed below:  IMPRESSION: 1. No acute cardiopulmonary findings. 2. No pulmonary infection. 3. No mediastinal adenopathy.   Assessment/Plan: 58 y.o. male with hyperglycemia and previous back abscess s/p I&D on 09/07.   - Area of concern to the right mid back is primarily induration without gross fluctuance and no appreciable drainage. Right now, I think we can hold off on I&D and allow for IV Abx. He understands this may consolidate into a drainable abscess and we will reassess tomorrow. He an have a diet today and then NPO at midnight. Continue Abx. He does understand importance of glycemic control. Will will follow up in the AM.     All of the above findings and recommendations were discussed with the patient, and all of patient's questions were answered to his expressed satisfaction.  Thank you for the opportunity to participate in this patient's care.   -- Arthea Platt, PA-C [facility] 04/02/2024, 8:09 AM M-F: 7am - 4pm

## 2024-04-02 NOTE — Discharge Instructions (Signed)
 Your nurse navigator, Goldia Ligman, can be reached at [phone]  [facility]     Damien Presser, Case Manager, [phone]  Legal Aid     [phone]

## 2024-04-02 NOTE — Plan of Care (Signed)

## 2024-04-02 NOTE — Progress Notes (Signed)
 Introduced patient to role of Statistician. Intake questions completed.   Patient lives with his nephew, who financially provides for their living situation. Patient is not employed and does not received SSI or SSDI. Patient does endorse some food insecurity at times. He states he previously received food stamps when he lived in [place], but when he came to [facility] he was told he didn't qualify r/t his criminal record. Will follow up.  Patient was incarcerated for greater than 15 years; however, he has been out for almost 20 years and is doing well. Information provided for [employer].   Patient states he does NOT have insurance coverage. Will consult financial navigator for Medicaid screening.   Encouraged to call with questions or concerns.

## 2024-04-02 NOTE — Inpatient Diabetes Management (Addendum)
 Inpatient Diabetes Program Recommendations  AACE/ADA: New Consensus Statement on Inpatient Glycemic Control (2015)  Target Ranges:  Prepandial:   less than 140 mg/dL      Peak postprandial:   less than 180 mg/dL (1-2 hours)      Critically ill patients:  140 - 180 mg/dL   Lab Results  Component Value Date   GLUCAP 126 (H) 04/02/2024   HGBA1C 14.8 (H) 04/01/2024    Review of Glycemic Control  Diabetes history: DM 2 Outpatient Diabetes medications:  Lantus  82 units qhs, metformin  500 mg bid, Glipizide  10 mg Daily Current orders for Inpatient glycemic control:  Lantus  30 units bid Novolog  0-20 units tid + hs Novolog  6 units tid meal coverage  A1c 14.8% on 9/23  Spoke with pt at bedside regarding A1c level of 14.8% and glucose control at home. Pt reports trends have been higher lately due to infection. [facility] and able to obtain medications without difficulty. Pt reports compliance to all medication. Pt reports last A1c was in the 7's and was good. Encouraged glucose control for wound healing in the mid 100 range. Pt reports knowing what to eat. Pt reports he will do better after leaving the hospital. Encouraged pt to follow up and recheck A1c level. Pt has all needed supplies at home for self management.  Thanks, Clotilda Bull RN, MSN, BC-ADM Inpatient Diabetes Coordinator Team Pager [phone] (8a-5p)

## 2024-04-02 NOTE — Progress Notes (Signed)
 Ppt complaining of throat pain. I went in and gave losartan  and insulin . After taking losartan  with water pt stated the pill was stuck in his throat. I gave him a little more water but pt is NPO. Pts breathing is unlabored and he is swallowing water fine. Pt requested to speak to MD so I made him aware.

## 2024-04-02 NOTE — Progress Notes (Signed)
 Pharmacy Antibiotic Note  Gary Henderson is a 58 y.o. male admitted on 04/01/2024 with a left upper back wound infection. PMH significant for diabetes and hypertension. Patient was previously seen on 9/7 in the ED for CC of 66 week old abscess. I&D was performed and was discharged with Cephalexin  and Bactrim  for 7 days. Now, presenting to the ED again with weakness and fatigue. Pharmacy has been consulted for Vancomycin  dosing.  9/23: Patient currently has an AKI. Scr is 1.25. Will continue to monitor. 9/24: Patient back at presumed baseline renal function. Last creatinine on file before this admission indicated BL 1.0-1.1.  Plan: - Adjust vancomycin  maintenance regimen to 1250 mg q12h eAUC 479, Cmax 32, Cmin 13 Scr 0.92, IBW, Vd 0.72 L/kg - Continue to monitor culture results for antibiotic de-escalation - Continue to monitor renal function daily while on vancomycin   Height: 6' (182.9 cm) Weight: 86.2 kg (190 lb) IBW/kg (Calculated) : 77.6  Temp (24hrs), Avg:98.7 F (37.1 C), Min:98.4 F (36.9 C), Max:98.9 F (37.2 C)  Recent Labs  Lab 04/01/24 1358 04/01/24 1622 04/01/24 1817 04/02/24 0459  WBC 9.7  --   --   --   CREATININE 1.25*  --   --  0.92  LATICACIDVEN  --  0.9 1.3  --     Estimated Creatinine Clearance: 96.1 mL/min (by C-G formula based on SCr of 0.92 mg/dL).    Allergies  Allergen Reactions   Gabapentin Shortness Of Breath    sweating   Lisinopril Swelling and Rash   Percocet [Oxycodone -Acetaminophen ]    Antimicrobials this admission: Zosyn  x 1 Vancomycin  9/23 >>   Dose adjustments this admission:  Microbiology results: 9/23 BCx: pending 9/24 GAS PCR: negative   Thank you for allowing pharmacy to be a part of this patient's care.  Will M. Lenon, PharmD, BCPS Clinical Pharmacist 04/02/2024 10:25 AM

## 2024-04-02 NOTE — Progress Notes (Signed)
 PROGRESS NOTE    Gary Henderson  FMW:[id number] DOB: [birth year] DOA: 04/01/2024 PCP: [facility]     Brief Narrative:   Gary Henderson is a 58 y.o. male with medical history significant of type 2 diabetes mellitus, hypertension and dyslipidemia came to ED with recurrent back abscess, not feeling well, subjective fever and chills.   Patient apparently came to ED 1-1/2 weeks ago with a large abscess on his upper back which was drained in the ED and he was started on antibiotics and sent home.  No cultures were obtained.  That area stopped draining and becoming painful and enlarging again.  Patient was not feeling well, subjective fever and chills, poor p.o. intake and appetite and feeling weak so came back to ED.   Patient denies any other recent illnesses, no upper respiratory or urinary symptoms.   Assessment & Plan:   Principal Problem:   Abscess of back, except buttock Active Problems:   Diabetes mellitus without complication (HCC)   Elevated serum creatinine   Metabolic acidosis   Hypertension   Pseudohyponatremia   Dyslipidemia   Abscess   Hyperglycemia   Uncontrolled diabetes mellitus with hypoglycemia (HCC)   # Abscess On back, s/p I and d earlier this month, completed course bactrim /keflex  and then course clinida as outpt. Still with significant induration but no fluctuance and only scant drainage, non-tender, no systemic symptoms - continue vanc - gen surg following, they are considering surgical exploration tomorrow  # T2DM Uncontrolled, on insulin  at home - continue basal/bolus/sliding scale - dm educator has seen  # Metabolic acidosis Resolved with fluids and insulin . Normal lactate - monitor  # HTN Controlled - home losartan    DVT prophylaxis: lovenox  Code Status: full Family Communication: none at bedside  Level of care: Med-Surg Status is: Inpatient Remains inpatient appropriate because: inpatient monitoring, possible  procedure    Consultants:  Gen surg  Procedures: None thus far  Antimicrobials:  vancomycin     Subjective: No pain, feeling fine  Objective: Vitals:   04/01/24 1708 04/01/24 2106 04/02/24 0508 04/02/24 0808  BP: (!) 151/86 110/68 (!) 141/95 (!) 148/89  Pulse: (!) 103 (!) 106 (!) 106 (!) 105  Resp: 16 19 18 18   Temp: 98.4 F (36.9 C) 98.8 F (37.1 C) 98.9 F (37.2 C) 98.9 F (37.2 C)  TempSrc:  Oral Oral Oral  SpO2: 99% 98% 96% 100%  Weight:      Height:        Intake/Output Summary (Last 24 hours) at 04/02/2024 1403 Last data filed at 04/02/2024 0900 Gross per 24 hour  Intake 1649 ml  Output --  Net 1649 ml   Filed Weights   04/01/24 1357  Weight: 86.2 kg    Examination:  General exam: Appears calm and comfortable  Respiratory system: Clear to auscultation. Respiratory effort normal. Cardiovascular system: S1 & S2 heard, RRR. No JVD, murmurs, rubs, gallops or clicks. No pedal edema. Gastrointestinal system: Abdomen is nondistended, soft and nontender. No organomegaly or masses felt. Normal bowel sounds heard. Central nervous system: Alert and oriented. No focal neurological deficits. Extremities: Symmetric 5 x 5 power. Skin: large indurated area on left back with central ulceration, no fluctuance, small amount of drainage  Psychiatry: Judgement and insight appear normal. Mood & affect appropriate.     Data Reviewed: I have personally reviewed following labs and imaging studies  CBC: Recent Labs  Lab 04/01/24 1358  WBC 9.7  HGB 16.0  HCT 47.5  MCV  94.8  PLT 212   Basic Metabolic Panel: Recent Labs  Lab 04/01/24 1358 04/02/24 0459  NA 129* 134*  K 5.0 3.9  CL 98 102  CO2 18* 26  GLUCOSE 472* 97  BUN 24* 15  CREATININE 1.25* 0.92  CALCIUM  8.9 8.5*   GFR: Estimated Creatinine Clearance: 96.1 mL/min (by C-G formula based on SCr of 0.92 mg/dL). Liver Function Tests: Recent Labs  Lab 04/01/24 1358  AST 14*  ALT 21  ALKPHOS 99   BILITOT 1.2  PROT 8.1  ALBUMIN 3.8   No results for input(s): LIPASE, AMYLASE in the last 168 hours. No results for input(s): AMMONIA in the last 168 hours. Coagulation Profile: No results for input(s): INR, PROTIME in the last 168 hours. Cardiac Enzymes: No results for input(s): CKTOTAL, CKMB, CKMBINDEX, TROPONINI in the last 168 hours. BNP (last 3 results) No results for input(s): PROBNP in the last 8760 hours. HbA1C: Recent Labs    04/01/24 1358  HGBA1C 14.8*   CBG: Recent Labs  Lab 04/01/24 2217 04/01/24 2313 04/02/24 0527 04/02/24 0810 04/02/24 1139  GLUCAP 64* 229* 113* 126* 242*   Lipid Profile: No results for input(s): CHOL, HDL, LDLCALC, TRIG, CHOLHDL, LDLDIRECT in the last 72 hours. Thyroid Function Tests: No results for input(s): TSH, T4TOTAL, FREET4, T3FREE, THYROIDAB in the last 72 hours. Anemia Panel: No results for input(s): VITAMINB12, FOLATE, FERRITIN, TIBC, IRON, RETICCTPCT in the last 72 hours. Urine analysis:    Component Value Date/Time   COLORURINE STRAW (A) 04/01/2024 2010   APPEARANCEUR CLEAR (A) 04/01/2024 2010   LABSPEC 1.036 (H) 04/01/2024 2010   PHURINE 5.0 04/01/2024 2010   GLUCOSEU >=500 (A) 04/01/2024 2010   HGBUR NEGATIVE 04/01/2024 2010   BILIRUBINUR NEGATIVE 04/01/2024 2010   KETONESUR 5 (A) 04/01/2024 2010   PROTEINUR NEGATIVE 04/01/2024 2010   NITRITE NEGATIVE 04/01/2024 2010   LEUKOCYTESUR NEGATIVE 04/01/2024 2010   Sepsis Labs: @LABRCNTIP (procalcitonin:4,lacticidven:4)  ) Recent Results (from the past 240 hours)  Blood culture (routine x 2)     Status: None (Preliminary result)   Collection Time: 04/01/24  4:27 PM   Specimen: BLOOD  Result Value Ref Range Status   Specimen Description BLOOD BLOOD LEFT ARM  Final   Special Requests   Final    BOTTLES DRAWN AEROBIC AND ANAEROBIC Blood Culture adequate volume   Culture   Final    NO GROWTH < 12 HOURS Performed at  [facility], [address] [place], [place] [address]    Report Status PENDING  Incomplete  Blood culture (routine x 2)     Status: None (Preliminary result)   Collection Time: 04/01/24  6:16 PM   Specimen: BLOOD  Result Value Ref Range Status   Specimen Description BLOOD BLOOD LEFT ARM  Final   Special Requests   Final    BOTTLES DRAWN AEROBIC AND ANAEROBIC Blood Culture adequate volume   Culture   Final    NO GROWTH < 12 HOURS Performed at [facility], [address], [place], [place] [address]    Report Status PENDING  Incomplete  Group A Strep by PCR     Status: None   Collection Time: 04/01/24 11:35 PM   Specimen: Throat  Result Value Ref Range Status   Group A Strep by PCR NOT DETECTED NOT DETECTED Final    Comment: Performed at [facility], [address] [place], [place] [address]         Radiology Studies: CT Chest  W Contrast Result Date: 04/01/2024 CLINICAL DATA:  Recent abscess incision and drainage. Blood glucose equal 472. Not feeling well EXAM: CT CHEST WITH CONTRAST TECHNIQUE: Multidetector CT imaging of the chest was performed during intravenous contrast administration. RADIATION DOSE REDUCTION: This exam was performed according to the departmental dose-optimization program which includes automated exposure control, adjustment of the mA and/or kV according to patient size and/or use of iterative reconstruction technique. CONTRAST:  OMNIPAQUE  IOHEXOL  300 MG/ML  SOLN COMPARISON:  None Available. FINDINGS: Cardiovascular: No significant vascular findings. Normal heart size. No pericardial effusion. Mediastinum/Nodes: No axillary or supraclavicular adenopathy. No mediastinal or hilar adenopathy. No pericardial fluid. Esophagus normal. Lungs/Pleura: No pulmonary infection. No pleural fluid. No airway obstruction. Upper Abdomen: Limited view of the liver, kidneys, pancreas are unremarkable. Normal adrenal glands. Musculoskeletal: No  aggressive osseous lesion. IMPRESSION: 1. No acute cardiopulmonary findings. 2. No pulmonary infection. 3. No mediastinal adenopathy. Electronically Signed   By: Jackquline Boxer M.D.   On: 04/01/2024 16:52        Scheduled Meds:  atorvastatin   20 mg Oral q1800   enoxaparin  (LOVENOX ) injection  40 mg Subcutaneous Q24H   feeding supplement  237 mL Oral BID BM   insulin  aspart  0-20 Units Subcutaneous TID WC   insulin  aspart  0-5 Units Subcutaneous QHS   insulin  aspart  6 Units Subcutaneous TID WC   insulin  glargine  30 Units Subcutaneous BID   losartan   50 mg Oral Daily   sodium chloride  flush  3 mL Intravenous Q12H   Continuous Infusions:  lactated ringers  100 mL/hr at 04/02/24 9188   vancomycin        LOS: 1 day     Devaughn KATHEE Ban, MD [facility]   If 7PM-7AM, please contact night-coverage [email or web address] Password [email or web address] 04/02/2024, 2:03 PM

## 2024-04-03 ENCOUNTER — Inpatient Hospital Stay: Payer: MEDICAID

## 2024-04-03 ENCOUNTER — Other Ambulatory Visit: Payer: Self-pay

## 2024-04-03 DIAGNOSIS — J36 Peritonsillar abscess: Secondary | ICD-10-CM | POA: Insufficient documentation

## 2024-04-03 DIAGNOSIS — L089 Local infection of the skin and subcutaneous tissue, unspecified: Secondary | ICD-10-CM

## 2024-04-03 LAB — BASIC METABOLIC PANEL WITH GFR
Anion gap: 9 (ref 5–15)
BUN: 13 mg/dL (ref 6–20)
CO2: 26 mmol/L (ref 22–32)
Calcium: 8.7 mg/dL — ABNORMAL LOW (ref 8.9–10.3)
Chloride: 100 mmol/L (ref 98–111)
Creatinine, Ser: 0.9 mg/dL (ref 0.61–1.24)
GFR, Estimated: >60 mL/min (ref >60)
Glucose, Bld: 168 mg/dL — ABNORMAL HIGH (ref 70–99)
Potassium: 4 mmol/L (ref 3.5–5.1)
Sodium: 135 mmol/L (ref 135–145)

## 2024-04-03 LAB — GLUCOSE, CAPILLARY
Glucose-Capillary: 154 mg/dL — ABNORMAL HIGH (ref 70–99)
Glucose-Capillary: 174 mg/dL — ABNORMAL HIGH (ref 70–99)

## 2024-04-03 LAB — CBC
HCT: 42.4 % (ref 39.0–52.0)
Hemoglobin: 14.5 g/dL (ref 13.0–17.0)
MCH: 32.5 pg (ref 26.0–34.0)
MCHC: 34.2 g/dL (ref 30.0–36.0)
MCV: 95.1 fL (ref 80.0–100.0)
Platelets: 174 K/uL (ref 150–400)
RBC: 4.46 MIL/uL (ref 4.22–5.81)
RDW: 12.7 % (ref 11.5–15.5)
WBC: 8.4 K/uL (ref 4.0–10.5)
nRBC: 0 % (ref 0.0–0.2)

## 2024-04-03 LAB — HEMOGLOBIN A1C
Hgb A1c MFr Bld: 14.2 % — ABNORMAL HIGH (ref 4.8–5.6)
Mean Plasma Glucose: 360.84 mg/dL

## 2024-04-03 MED ORDER — AMOXICILLIN-POT CLAVULANATE 875-125 MG PO TABS
1.0000 | ORAL_TABLET | Freq: Two times a day (BID) | ORAL | 0 refills | Status: AC
  Filled 2024-04-03: qty 28, 14d supply, fill #0

## 2024-04-03 NOTE — Discharge Summary (Signed)
 Gary Henderson FMW:[id number] DOB: [birth year] DOA: 04/01/2024  PCP: [facility]  Admit date: 04/01/2024 Discharge date: 04/03/2024  Time spent: 35 minutes  Recommendations for Outpatient Follow-up:  Pcp f/u tomorrow as scheduled Attention to diabetes control Monitor throat and back for healing     Discharge Diagnoses:  Principal Problem:   Abscess of back, except buttock Active Problems:   Diabetes mellitus without complication (HCC)   Elevated serum creatinine   Metabolic acidosis   Hypertension   Pseudohyponatremia   Dyslipidemia   Abscess   Hyperglycemia   Uncontrolled diabetes mellitus with hypoglycemia (HCC)   Soft tissue infection   Peritonsillar cellulitis   Discharge Condition: stable  Diet recommendation: heart healthy carb modified  Filed Weights   04/01/24 1357  Weight: 86.2 kg    History of present illness:  From admission h and p  Gary Henderson is a 58 y.o. male with medical history significant of type 2 diabetes mellitus, hypertension and dyslipidemia came to ED with recurrent back abscess, not feeling well, subjective fever and chills.   Patient apparently came to ED 1-1/2 weeks ago with a large abscess on his upper back which was drained in the ED and he was started on antibiotics and sent home.  No cultures were obtained.  That area stopped draining and becoming painful and enlarging again.  Patient was not feeling well, subjective fever and chills, poor p.o. intake and appetite and feeling weak so came back to ED.   Patient denies any other recent illnesses, no upper respiratory or urinary symptoms.    Hospital Course:   Patient presents with back abscess. Treated with I and D earlier this month in our ER, no culture sent, discharged with bactrim  and keflex . Followed up at pcp's office, culture sent (I called [facility] and was told culture was no growth), patient was placed on course of clindamycin . He presented here for  continued swelling in the area. Indeed there is significant induration there approximately 10 cm but no fluctuance, non-tender, no systemic symptoms. Evaluated by gen surg, thinks this represents ongoing inflammation, no surgical drainage needed, should eventually resolve. They did advise discharge on a course of augmentin . Patient also complains of throat pain, mainly with swallowing liquids. GAS swab negative. On exam there is swelling of the left peritonsilar area. No signs deep infect, there is no jaw pain or swelling, no trismus, no hot potato voice, no trouble breathing or swallowing. Will treat for peritonsillar cellulitis with a 2 week course of augmentin . Strict return precautions reviewed - if symtpoms worsen or fail to improve would need CT of neck to evaluate for complications including abscess. Patient also with uncontrolled diabetes. Patient has pcp f/u tomorrow, can follow up all these issues then.   Procedures: none   Consultations: Gen surg  Discharge Exam: Vitals:   04/03/24 0427 04/03/24 0736  BP: (!) 140/98 (!) 145/93  Pulse: (!) 106 (!) 109  Resp: 18 18  Temp: 98.7 F (37.1 C) 98.4 F (36.9 C)  SpO2: 100% 99%    General: NAD Heent: swelling left peritonsilar space Cardiovascular: RRR Respiratory: CTAB Skin: induration 10 cm left upper back no fluctuance  Discharge Instructions   Discharge Instructions     Diet - low sodium heart healthy   Complete by: As directed    Increase activity slowly   Complete by: As directed       Allergies as of 04/03/2024       Reactions  Gabapentin Shortness Of Breath   sweating   Lisinopril Swelling, Rash   Percocet [oxycodone -acetaminophen ]         Medication List     STOP taking these medications    clindamycin  300 MG capsule Commonly known as: CLEOCIN    doxycycline 100 MG capsule Commonly known as: MONODOX       TAKE these medications    amLODipine 10 MG tablet Commonly known as: NORVASC Take 10  mg by mouth daily.   amoxicillin -clavulanate 875-125 MG tablet Commonly known as: AUGMENTIN  Take 1 tablet by mouth 2 (two) times daily.   aspirin 81 MG chewable tablet Chew 81 mg by mouth daily.   Cialis 20 MG tablet Generic drug: tadalafil Take 20 mg by mouth daily as needed for erectile dysfunction.   insulin  glargine 100 UNIT/ML Solostar Pen Commonly known as: LANTUS  Inject 80 Units into the skin daily for 30 days. What changed:  how much to take when to take this   Insulin  Pen Needle 31G X 8 MM Misc Commonly known as: Aurora Pen Needles 1 each by Does not apply route daily. Can dispense generic for lantus  solostar pen   Jardiance 25 MG Tabs tablet Generic drug: empagliflozin Take 25 mg by mouth daily.   losartan -hydrochlorothiazide 50-12.5 MG tablet Commonly known as: HYZAAR Take 1 tablet by mouth daily.       Allergies  Allergen Reactions   Gabapentin Shortness Of Breath    sweating   Lisinopril Swelling and Rash   Percocet [Oxycodone -Acetaminophen ]     Follow-up Information     [facility] Follow up.   Specialty: General Practice Why: hospital follow up Contact information: [address] [place] [address] [phone]                  The results of significant diagnostics from this hospitalization (including imaging, microbiology, ancillary and laboratory) are listed below for reference.    Significant Diagnostic Studies: DG ESOPHAGUS W SINGLE CM (SOL OR THIN BA) Result Date: 04/03/2024 CLINICAL DATA:  58 year old male with a recent history of painful swallowing and approximately 10 pound weight loss over the past 2 weeks due to decreased oral intake. Patient reports when he tucks his chin and swallows he gets a sharp pain on the left side of his neck; however, this does not occur if he does not tuck is chin. He denies any history of GERD or any other symptoms at this time. EXAM: ESOPHAGUS/BARIUM SWALLOW/TABLET  STUDY TECHNIQUE: Combined double and single contrast examination was performed using effervescent crystals, high-density barium, and thin liquid barium. This exam was performed by [facility] PA-C, and was supervised and interpreted by Dr. KANDICE Moan. FLUOROSCOPY: Radiation Exposure Index (as provided by the fluoroscopic device): 49.7 mGy Kerma COMPARISON:  None Available. FINDINGS: Swallowing: Appears normal. No vestibular penetration or aspiration seen. Pharynx: Significant residue noted throughout initial swallows which eventually cleared on its own as the exam continued. Esophagus: Normal appearance. No obvious mucosal abnormality or visible ulceration. Esophageal motility: Intermittent proximal escape with mildly delayed passage of contrast from the esophagus into the stomach. Hiatal Hernia: None visualized. Gastroesophageal reflux: None visualized with water siphon test, coughing, or Valsalva. Ingested 13mm barium tablet: Passed without difficulty into the stomach. Other: None IMPRESSION: Mild esophageal dysmotility. Electronically Signed   By: Marcey Moan M.D.   On: 04/03/2024 14:12   CT Chest W Contrast Result Date: 04/01/2024 CLINICAL DATA:  Recent abscess incision and drainage. Blood glucose equal 472.  Not feeling well EXAM: CT CHEST WITH CONTRAST TECHNIQUE: Multidetector CT imaging of the chest was performed during intravenous contrast administration. RADIATION DOSE REDUCTION: This exam was performed according to the departmental dose-optimization program which includes automated exposure control, adjustment of the mA and/or kV according to patient size and/or use of iterative reconstruction technique. CONTRAST:  100mL OMNIPAQUE  IOHEXOL  300 MG/ML  SOLN COMPARISON:  None Available. FINDINGS: Cardiovascular: No significant vascular findings. Normal heart size. No pericardial effusion. Mediastinum/Nodes: No axillary or supraclavicular adenopathy. No mediastinal or hilar adenopathy. No pericardial  fluid. Esophagus normal. Lungs/Pleura: No pulmonary infection. No pleural fluid. No airway obstruction. Upper Abdomen: Limited view of the liver, kidneys, pancreas are unremarkable. Normal adrenal glands. Musculoskeletal: No aggressive osseous lesion. IMPRESSION: 1. No acute cardiopulmonary findings. 2. No pulmonary infection. 3. No mediastinal adenopathy. Electronically Signed   By: Jackquline Boxer M.D.   On: 04/01/2024 16:52    Microbiology: Recent Results (from the past 240 hours)  Blood culture (routine x 2)     Status: None (Preliminary result)   Collection Time: 04/01/24  4:27 PM   Specimen: BLOOD  Result Value Ref Range Status   Specimen Description BLOOD BLOOD LEFT ARM  Final   Special Requests   Final    BOTTLES DRAWN AEROBIC AND ANAEROBIC Blood Culture adequate volume   Culture   Final    NO GROWTH 2 DAYS Performed at [facility], [address], [place], [place] [address]    Report Status PENDING  Incomplete  Blood culture (routine x 2)     Status: None (Preliminary result)   Collection Time: 04/01/24  6:16 PM   Specimen: BLOOD  Result Value Ref Range Status   Specimen Description BLOOD BLOOD LEFT ARM  Final   Special Requests   Final    BOTTLES DRAWN AEROBIC AND ANAEROBIC Blood Culture adequate volume   Culture   Final    NO GROWTH 2 DAYS Performed at [facility], [address] [place], [place] [address]    Report Status PENDING  Incomplete  Group A Strep by PCR     Status: None   Collection Time: 04/01/24 11:35 PM   Specimen: Throat  Result Value Ref Range Status   Group A Strep by PCR NOT DETECTED NOT DETECTED Final    Comment: Performed at [facility], [address] [place], [place] [address]  Aerobic Culture w Gram Stain (superficial specimen)     Status: None (Preliminary result)   Collection Time: 04/02/24  2:09 PM   Specimen: Wound  Result Value Ref Range Status   Specimen Description   Final    WOUND Performed at  [facility], [address], [place], [place] [address]    Special Requests   Final    ABSCESS S T Performed at [facility], [address] [place], [place] [address]    Gram Stain   Final    RARE WBC PRESENT,BOTH PMN AND MONONUCLEAR NO ORGANISMS SEEN    Culture   Final    NO GROWTH < 12 HOURS Performed at [facility], [address] [place], [place] [address]    Report Status PENDING  Incomplete     Labs: Basic Metabolic Panel: Recent Labs  Lab 04/01/24 1358 04/02/24 0459 04/03/24 0456  NA 129* 134* 135  K 5.0 3.9 4.0  CL 98 102 100  CO2 18* 26 26  GLUCOSE 472* 97 168*  BUN 24* 15 13  CREATININE  1.25* 0.92 0.90  CALCIUM  8.9 8.5* 8.7*   Liver Function Tests: Recent Labs  Lab 04/01/24 1358  AST 14*  ALT 21  ALKPHOS 99  BILITOT 1.2  PROT 8.1  ALBUMIN 3.8   No results for input(s): LIPASE, AMYLASE in the last 168 hours. No results for input(s): AMMONIA in the last 168 hours. CBC: Recent Labs  Lab 04/01/24 1358 04/03/24 0456  WBC 9.7 8.4  HGB 16.0 14.5  HCT 47.5 42.4  MCV 94.8 95.1  PLT 212 174   Cardiac Enzymes: No results for input(s): CKTOTAL, CKMB, CKMBINDEX, TROPONINI in the last 168 hours. BNP: BNP (last 3 results) No results for input(s): BNP in the last 8760 hours.  ProBNP (last 3 results) No results for input(s): PROBNP in the last 8760 hours.  CBG: Recent Labs  Lab 04/02/24 1139 04/02/24 1611 04/02/24 2130 04/03/24 0738 04/03/24 1141  GLUCAP 242* 292* 357* 154* 174*       Signed:  Devaughn KATHEE Ban MD.  [facility] 04/03/2024, 3:41 PM

## 2024-04-03 NOTE — Plan of Care (Signed)
   Problem: Coping: Goal: Ability to adjust to condition or change in health will improve Outcome: Progressing   Problem: Fluid Volume: Goal: Ability to maintain a balanced intake and output will improve Outcome: Progressing   Problem: Health Behavior/Discharge Planning: Goal: Ability to identify and utilize available resources and services will improve Outcome: Progressing Goal: Ability to manage health-related needs will improve Outcome: Progressing   Problem: Metabolic: Goal: Ability to maintain appropriate glucose levels will improve Outcome: Progressing   Problem: Nutritional: Goal: Maintenance of adequate nutrition will improve Outcome: Progressing Goal: Progress toward achieving an optimal weight will improve Outcome: Progressing   Problem: Skin Integrity: Goal: Risk for impaired skin integrity will decrease Outcome: Progressing

## 2024-04-03 NOTE — TOC Initial Note (Signed)
 Transition of Care [facility]) - Initial/Assessment Note    Patient Details  Name: Gary Henderson MRN: [id number] Date of Birth: 26-Jan-1966  Transition of Care [facility] CM/SW Contact:    Dalia GORMAN Fuse, RN Phone Number: 04/03/2024, 10:42 AM  Clinical Narrative:                  Patient is from home. TOC reviewed patients medical record. He is uninsured; referral for SSDI and Medicaid screening sent to finance by Annabella Essex. Outreach to [facility] if needs identified.        Patient Goals and CMS Choice            Expected Discharge Plan and Services                                              Prior Living Arrangements/Services                       Activities of Daily Living   ADL Screening (condition at time of admission) Independently performs ADLs?: Yes (appropriate for developmental age) Is the patient deaf or have difficulty hearing?: No Does the patient have difficulty seeing, even when wearing glasses/contacts?: No Does the patient have difficulty concentrating, remembering, or making decisions?: No  Permission Sought/Granted                  Emotional Assessment              Admission diagnosis:  Abscess of back, except buttock [L02.212] Abscess [L02.91] Patient Active Problem List   Diagnosis Date Noted   Abscess of back, except buttock 04/01/2024   Dyslipidemia 04/01/2024   Pseudohyponatremia 04/01/2024   Metabolic acidosis 04/01/2024   Elevated serum creatinine 04/01/2024   Abscess 04/01/2024   Hyperglycemia 04/01/2024   Uncontrolled diabetes mellitus with hypoglycemia (HCC) 04/01/2024   Diabetes mellitus without complication (HCC)    Hypertension    PCP:  [facility] Pharmacy:   [facility] [address] ([address]), [place] - [address] [place]) [place] [address] Phone: [phone] Fax: [phone]  [facility] [place], [place] - [address] [place] [address] Phone: [phone] Fax: [phone]     Social Drivers of Health (SDOH) Social History: SDOH Screenings   Food Insecurity: No Food Insecurity (04/01/2024)  Housing: Low Risk  (04/01/2024)  Transportation Needs: No Transportation Needs (04/01/2024)  Utilities: Not At Risk (04/01/2024)  Social Connections: Patient Declined (04/01/2024)  Tobacco Use: High Risk (04/01/2024)   SDOH Interventions:     Readmission Risk Interventions     No data to display

## 2024-04-03 NOTE — Progress Notes (Addendum)
 CC: soft tissue infection back Subjective: No pain on back, does c/o swallowing issues and pain upon swallowing No gorwth from wound   Objective: Vital signs in last 24 hours: Temp:  [98 F (36.7 C)-99.5 F (37.5 C)] 98.4 F (36.9 C) (09/25 0736) Pulse Rate:  [106-110] 109 (09/25 0736) Resp:  [18] 18 (09/25 0736) BP: (140-157)/(92-98) 145/93 (09/25 0736) SpO2:  [99 %-100 %] 99 % (09/25 0736) Last BM Date : 04/02/24  Intake/Output from previous day: 09/24 0701 - 09/25 0700 In: 340 [P.O.:340] Out: 1400 [Urine:1400] Intake/Output this shift: No intake/output data recorded.  Physical exam:  NAD  Neck: mild tenderness on left side no definitive masses Abd: soft, non tneder Skin: 10x 10 cm indurated area left back c/w soft tissue infection, no fluctuance  Lab Results: CBC  Recent Labs    04/01/24 1358 04/03/24 0456  WBC 9.7 8.4  HGB 16.0 14.5  HCT 47.5 42.4  PLT 212 174   BMET Recent Labs    04/02/24 0459 04/03/24 0456  NA 134* 135  K 3.9 4.0  CL 102 100  CO2 26 26  GLUCOSE 97 168*  BUN 15 13  CREATININE 0.92 0.90  CALCIUM  8.5* 8.7*   PT/INR No results for input(s): LABPROT, INR in the last 72 hours. ABG No results for input(s): PHART, HCO3 in the last 72 hours.  Invalid input(s): PCO2, PO2  Studies/Results: CT Chest W Contrast Result Date: 04/01/2024 CLINICAL DATA:  Recent abscess incision and drainage. Blood glucose equal 472. Not feeling well EXAM: CT CHEST WITH CONTRAST TECHNIQUE: Multidetector CT imaging of the chest was performed during intravenous contrast administration. RADIATION DOSE REDUCTION: This exam was performed according to the departmental dose-optimization program which includes automated exposure control, adjustment of the mA and/or kV according to patient size and/or use of iterative reconstruction technique. CONTRAST:  OMNIPAQUE  IOHEXOL  300 MG/ML  SOLN COMPARISON:  None Available. FINDINGS: Cardiovascular: No  significant vascular findings. Normal heart size. No pericardial effusion. Mediastinum/Nodes: No axillary or supraclavicular adenopathy. No mediastinal or hilar adenopathy. No pericardial fluid. Esophagus normal. Lungs/Pleura: No pulmonary infection. No pleural fluid. No airway obstruction. Upper Abdomen: Limited view of the liver, kidneys, pancreas are unremarkable. Normal adrenal glands. Musculoskeletal: No aggressive osseous lesion. IMPRESSION: 1. No acute cardiopulmonary findings. 2. No pulmonary infection. 3. No mediastinal adenopathy. Electronically Signed   By: Jackquline Boxer M.D.   On: 04/01/2024 16:52    Anti-infectives: Anti-infectives (From admission, onward)    Start     Dose/Rate Route Frequency Ordered Stop   04/02/24 1800  vancomycin  (VANCOREADY) IVPB 1250 mg/250 mL        1,250 mg 166.7 mL/hr over 90 Minutes Intravenous Every 12 hours 04/02/24 1019     04/02/24 0739  vancomycin  variable dose per unstable renal function (pharmacist dosing)  Status:  Discontinued         Does not apply See admin instructions 04/02/24 0739 04/02/24 1027   04/02/24 0530  vancomycin  (VANCOCIN ) IVPB 1000 mg/200 mL premix  Status:  Discontinued        1,000 mg 200 mL/hr over 60 Minutes Intravenous Every 12 hours 04/01/24 1652 04/02/24 1019   04/01/24 1730  vancomycin  (VANCOREADY) IVPB 2000 mg/400 mL        2,000 mg 200 mL/hr over 120 Minutes Intravenous  Once 04/01/24 1652 04/02/24 1230   04/01/24 1630  piperacillin -tazobactam (ZOSYN ) IVPB 3.375 g        3.375 g 100 mL/hr over 30 Minutes Intravenous  Once 04/01/24 1618 04/02/24 1229       Assessment/Plan: Doing well from soft tissue infection, induration will anticipate to be present for a couple of weeks NO need for drainage debridement at this time Dysphagia, will order swallow study and may need CT neck, we will let Dr. Kandis evaluate from this perspective We will be available and will see him as outpt Recommend continuation of antibiotics  empirically for another week w oral augmentin  I personally spent a total of 35 minutes in the care of the patient today including performing a medically appropriate exam/evaluation, counseling and educating, placing orders, referring and communicating with other health care professionals, documenting clinical information in the EHR, independently interpreting and reviewing images studies and coordinating care.     Laneta Luna, MD, [facility]  04/03/2024

## 2024-04-06 LAB — CULTURE, BLOOD (ROUTINE X 2)
Culture: NO GROWTH
Culture: NO GROWTH
Special Requests: ADEQUATE
Special Requests: ADEQUATE

## 2024-04-06 LAB — AEROBIC CULTURE W GRAM STAIN (SUPERFICIAL SPECIMEN)

## 2024-04-08 ENCOUNTER — Telehealth: Payer: Self-pay | Admitting: Obstetrics and Gynecology

## 2024-04-08 ENCOUNTER — Telehealth: Payer: Self-pay

## 2024-04-08 NOTE — Telephone Encounter (Signed)
 Abscess culture growing mrsa, for 3 days in a row I have attempted to reach the patient (calling his number and the number of his listed contact). No answer either number nor ability to leave message.
